# Patient Record
Sex: Female | Born: 1960 | Race: Black or African American | Hispanic: No | Marital: Single | Smoking: Never smoker
Health system: Southern US, Community
[De-identification: ages and names within clinical notes are randomized; demographics above are authoritative.]

## PROBLEM LIST (undated history)

## (undated) DIAGNOSIS — R102 Pelvic and perineal pain: Secondary | ICD-10-CM

## (undated) DIAGNOSIS — F4321 Adjustment disorder with depressed mood: Secondary | ICD-10-CM

## (undated) DIAGNOSIS — B009 Herpesviral infection, unspecified: Secondary | ICD-10-CM

## (undated) DIAGNOSIS — F419 Anxiety disorder, unspecified: Secondary | ICD-10-CM

## (undated) DIAGNOSIS — E66812 Obesity, class 2: Secondary | ICD-10-CM

## (undated) DIAGNOSIS — R51 Headache: Secondary | ICD-10-CM

## (undated) DIAGNOSIS — M069 Rheumatoid arthritis, unspecified: Secondary | ICD-10-CM

## (undated) DIAGNOSIS — I10 Essential (primary) hypertension: Secondary | ICD-10-CM

## (undated) DIAGNOSIS — J302 Other seasonal allergic rhinitis: Secondary | ICD-10-CM

## (undated) DIAGNOSIS — D259 Leiomyoma of uterus, unspecified: Secondary | ICD-10-CM

## (undated) DIAGNOSIS — R011 Cardiac murmur, unspecified: Secondary | ICD-10-CM

## (undated) DIAGNOSIS — A048 Other specified bacterial intestinal infections: Secondary | ICD-10-CM

## (undated) DIAGNOSIS — Z8742 Personal history of other diseases of the female genital tract: Secondary | ICD-10-CM

## (undated) DIAGNOSIS — F439 Reaction to severe stress, unspecified: Secondary | ICD-10-CM

## (undated) DIAGNOSIS — E785 Hyperlipidemia, unspecified: Secondary | ICD-10-CM

## (undated) DIAGNOSIS — N926 Irregular menstruation, unspecified: Secondary | ICD-10-CM

## (undated) DIAGNOSIS — D72829 Elevated white blood cell count, unspecified: Secondary | ICD-10-CM

## (undated) DIAGNOSIS — R112 Nausea with vomiting, unspecified: Secondary | ICD-10-CM

## (undated) DIAGNOSIS — I119 Hypertensive heart disease without heart failure: Secondary | ICD-10-CM

## (undated) DIAGNOSIS — E559 Vitamin D deficiency, unspecified: Secondary | ICD-10-CM

## (undated) DIAGNOSIS — J329 Chronic sinusitis, unspecified: Secondary | ICD-10-CM

## (undated) DIAGNOSIS — B373 Candidiasis of vulva and vagina: Secondary | ICD-10-CM

## (undated) DIAGNOSIS — Z9889 Other specified postprocedural states: Secondary | ICD-10-CM

## (undated) DIAGNOSIS — D219 Benign neoplasm of connective and other soft tissue, unspecified: Secondary | ICD-10-CM

## (undated) DIAGNOSIS — E7800 Pure hypercholesterolemia, unspecified: Secondary | ICD-10-CM

## (undated) DIAGNOSIS — R002 Palpitations: Secondary | ICD-10-CM

## (undated) DIAGNOSIS — Z8719 Personal history of other diseases of the digestive system: Secondary | ICD-10-CM

## (undated) DIAGNOSIS — Z87898 Personal history of other specified conditions: Secondary | ICD-10-CM

## (undated) HISTORY — DX: Rheumatoid arthritis, unspecified: M06.9

## (undated) HISTORY — DX: Other seasonal allergic rhinitis: J30.2

## (undated) HISTORY — DX: Cardiac murmur, unspecified: R01.1

## (undated) HISTORY — DX: Pelvic and perineal pain: R10.2

## (undated) HISTORY — DX: Other specified bacterial intestinal infections: A04.8

## (undated) HISTORY — DX: Reaction to severe stress, unspecified: F43.9

## (undated) HISTORY — DX: Adjustment disorder with depressed mood: F43.21

## (undated) HISTORY — DX: Candidiasis of vulva and vagina: B37.3

## (undated) HISTORY — DX: Personal history of other specified conditions: Z87.898

## (undated) HISTORY — DX: Obesity, class 2: E66.812

## (undated) HISTORY — DX: Irregular menstruation, unspecified: N92.6

## (undated) HISTORY — DX: Hypertensive heart disease without heart failure: I11.9

## (undated) HISTORY — DX: Herpesviral infection, unspecified: B00.9

## (undated) HISTORY — DX: Hyperlipidemia, unspecified: E78.5

## (undated) HISTORY — DX: Anxiety disorder, unspecified: F41.9

## (undated) HISTORY — DX: Personal history of other diseases of the digestive system: Z87.19

## (undated) HISTORY — PX: GANGLION CYST EXCISION: SHX1691

## (undated) HISTORY — DX: Personal history of other diseases of the female genital tract: Z87.42

## (undated) HISTORY — DX: Morbid (severe) obesity due to excess calories: E66.01

## (undated) HISTORY — DX: Leiomyoma of uterus, unspecified: D25.9

## (undated) HISTORY — DX: Pure hypercholesterolemia, unspecified: E78.00

## (undated) HISTORY — PX: BUNIONECTOMY: SHX129

## (undated) HISTORY — DX: Benign neoplasm of connective and other soft tissue, unspecified: D21.9

## (undated) HISTORY — DX: Elevated white blood cell count, unspecified: D72.829

## (undated) HISTORY — DX: Vitamin D deficiency, unspecified: E55.9

## (undated) HISTORY — DX: Chronic sinusitis, unspecified: J32.9

## (undated) HISTORY — DX: Palpitations: R00.2

---

## 1997-09-21 DIAGNOSIS — N926 Irregular menstruation, unspecified: Secondary | ICD-10-CM

## 1997-09-21 HISTORY — DX: Irregular menstruation, unspecified: N92.6

## 1999-09-22 DIAGNOSIS — Z8742 Personal history of other diseases of the female genital tract: Secondary | ICD-10-CM

## 1999-09-22 DIAGNOSIS — D259 Leiomyoma of uterus, unspecified: Secondary | ICD-10-CM

## 1999-09-22 HISTORY — DX: Leiomyoma of uterus, unspecified: D25.9

## 1999-09-22 HISTORY — DX: Personal history of other diseases of the female genital tract: Z87.42

## 1999-11-10 ENCOUNTER — Ambulatory Visit (HOSPITAL_COMMUNITY): Admission: RE | Admit: 1999-11-10 | Discharge: 1999-11-10 | Payer: Self-pay | Admitting: Obstetrics and Gynecology

## 1999-11-10 ENCOUNTER — Encounter: Payer: Self-pay | Admitting: Obstetrics and Gynecology

## 2000-08-04 ENCOUNTER — Other Ambulatory Visit: Admission: RE | Admit: 2000-08-04 | Discharge: 2000-08-04 | Payer: Self-pay | Admitting: Obstetrics and Gynecology

## 2000-08-04 ENCOUNTER — Encounter (INDEPENDENT_AMBULATORY_CARE_PROVIDER_SITE_OTHER): Payer: Self-pay

## 2000-11-16 ENCOUNTER — Encounter: Payer: Self-pay | Admitting: Obstetrics and Gynecology

## 2000-11-16 ENCOUNTER — Ambulatory Visit (HOSPITAL_COMMUNITY): Admission: RE | Admit: 2000-11-16 | Discharge: 2000-11-16 | Payer: Self-pay | Admitting: Obstetrics and Gynecology

## 2001-09-21 DIAGNOSIS — B3731 Acute candidiasis of vulva and vagina: Secondary | ICD-10-CM

## 2001-09-21 DIAGNOSIS — E7800 Pure hypercholesterolemia, unspecified: Secondary | ICD-10-CM

## 2001-09-21 DIAGNOSIS — B373 Candidiasis of vulva and vagina: Secondary | ICD-10-CM

## 2001-09-21 HISTORY — DX: Acute candidiasis of vulva and vagina: B37.31

## 2001-09-21 HISTORY — DX: Pure hypercholesterolemia, unspecified: E78.00

## 2001-09-21 HISTORY — DX: Candidiasis of vulva and vagina: B37.3

## 2002-04-04 ENCOUNTER — Ambulatory Visit (HOSPITAL_COMMUNITY): Admission: RE | Admit: 2002-04-04 | Discharge: 2002-04-04 | Payer: Self-pay | Admitting: Obstetrics and Gynecology

## 2002-04-04 ENCOUNTER — Encounter: Payer: Self-pay | Admitting: Obstetrics and Gynecology

## 2002-05-23 DIAGNOSIS — B009 Herpesviral infection, unspecified: Secondary | ICD-10-CM

## 2002-05-23 HISTORY — DX: Herpesviral infection, unspecified: B00.9

## 2002-09-21 DIAGNOSIS — F439 Reaction to severe stress, unspecified: Secondary | ICD-10-CM

## 2002-09-21 DIAGNOSIS — B373 Candidiasis of vulva and vagina: Secondary | ICD-10-CM

## 2002-09-21 DIAGNOSIS — B3731 Acute candidiasis of vulva and vagina: Secondary | ICD-10-CM

## 2002-09-21 HISTORY — DX: Reaction to severe stress, unspecified: F43.9

## 2002-09-21 HISTORY — DX: Candidiasis of vulva and vagina: B37.3

## 2002-09-21 HISTORY — DX: Acute candidiasis of vulva and vagina: B37.31

## 2003-05-29 ENCOUNTER — Other Ambulatory Visit: Admission: RE | Admit: 2003-05-29 | Discharge: 2003-05-29 | Payer: Self-pay | Admitting: Obstetrics and Gynecology

## 2003-06-19 ENCOUNTER — Encounter: Payer: Self-pay | Admitting: Obstetrics and Gynecology

## 2003-06-19 ENCOUNTER — Ambulatory Visit (HOSPITAL_COMMUNITY): Admission: RE | Admit: 2003-06-19 | Discharge: 2003-06-19 | Payer: Self-pay | Admitting: Obstetrics and Gynecology

## 2004-06-11 ENCOUNTER — Other Ambulatory Visit: Admission: RE | Admit: 2004-06-11 | Discharge: 2004-06-11 | Payer: Self-pay | Admitting: Obstetrics and Gynecology

## 2004-06-24 ENCOUNTER — Ambulatory Visit (HOSPITAL_COMMUNITY): Admission: RE | Admit: 2004-06-24 | Discharge: 2004-06-24 | Payer: Self-pay | Admitting: Obstetrics and Gynecology

## 2004-09-21 DIAGNOSIS — Z8719 Personal history of other diseases of the digestive system: Secondary | ICD-10-CM

## 2004-09-21 HISTORY — DX: Personal history of other diseases of the digestive system: Z87.19

## 2005-06-24 ENCOUNTER — Other Ambulatory Visit: Admission: RE | Admit: 2005-06-24 | Discharge: 2005-06-24 | Payer: Self-pay | Admitting: Obstetrics and Gynecology

## 2005-06-30 ENCOUNTER — Ambulatory Visit (HOSPITAL_COMMUNITY): Admission: RE | Admit: 2005-06-30 | Discharge: 2005-06-30 | Payer: Self-pay | Admitting: Obstetrics and Gynecology

## 2005-07-17 ENCOUNTER — Ambulatory Visit (HOSPITAL_COMMUNITY): Admission: RE | Admit: 2005-07-17 | Discharge: 2005-07-17 | Payer: Self-pay | Admitting: Gastroenterology

## 2005-07-17 ENCOUNTER — Encounter (INDEPENDENT_AMBULATORY_CARE_PROVIDER_SITE_OTHER): Payer: Self-pay | Admitting: *Deleted

## 2006-07-01 ENCOUNTER — Ambulatory Visit (HOSPITAL_COMMUNITY): Admission: RE | Admit: 2006-07-01 | Discharge: 2006-07-01 | Payer: Self-pay | Admitting: Obstetrics and Gynecology

## 2006-07-12 ENCOUNTER — Other Ambulatory Visit: Admission: RE | Admit: 2006-07-12 | Discharge: 2006-07-12 | Payer: Self-pay | Admitting: Obstetrics and Gynecology

## 2007-07-04 ENCOUNTER — Ambulatory Visit (HOSPITAL_COMMUNITY): Admission: RE | Admit: 2007-07-04 | Discharge: 2007-07-04 | Payer: Self-pay | Admitting: Obstetrics and Gynecology

## 2007-09-22 DIAGNOSIS — J329 Chronic sinusitis, unspecified: Secondary | ICD-10-CM

## 2007-09-22 HISTORY — DX: Chronic sinusitis, unspecified: J32.9

## 2008-05-20 ENCOUNTER — Emergency Department (HOSPITAL_COMMUNITY): Admission: EM | Admit: 2008-05-20 | Discharge: 2008-05-20 | Payer: Self-pay | Admitting: Emergency Medicine

## 2008-07-04 ENCOUNTER — Ambulatory Visit (HOSPITAL_COMMUNITY): Admission: RE | Admit: 2008-07-04 | Discharge: 2008-07-04 | Payer: Self-pay | Admitting: Obstetrics and Gynecology

## 2008-07-17 ENCOUNTER — Encounter: Admission: RE | Admit: 2008-07-17 | Discharge: 2008-07-17 | Payer: Self-pay | Admitting: Obstetrics and Gynecology

## 2009-08-13 ENCOUNTER — Ambulatory Visit (HOSPITAL_COMMUNITY): Admission: RE | Admit: 2009-08-13 | Discharge: 2009-08-13 | Payer: Self-pay | Admitting: Obstetrics and Gynecology

## 2010-09-09 ENCOUNTER — Ambulatory Visit (HOSPITAL_COMMUNITY)
Admission: RE | Admit: 2010-09-09 | Discharge: 2010-09-09 | Payer: Self-pay | Source: Home / Self Care | Attending: Family Medicine | Admitting: Family Medicine

## 2010-10-12 ENCOUNTER — Encounter: Payer: Self-pay | Admitting: Obstetrics and Gynecology

## 2011-02-06 NOTE — Op Note (Signed)
Stacey Turner, Stacey Turner                 ACCOUNT NO.:  [id number]   MEDICAL RECORD NO.:  [id number]            PATIENT TYPE:   LOCATION:                                 FACILITY:   PHYSICIAN:  Anselmo Rod, M.D.  DATE OF BIRTH:  [birth year]   DATE OF PROCEDURE:  07/17/2005  DATE OF DISCHARGE:                                 OPERATIVE REPORT   PROCEDURE:  Colonoscopy with cold biopsies x2.   ENDOSCOPIST:  Anselmo Rod, M.D.   INSTRUMENT:  Olympus videocolonoscope.   INDICATIONS FOR PROCEDURE:  A 50 year old African-American female with a  family history of colon cancer in her father who was diagnosed in his 74s,  rule out colonic polyps, masses, etcetera.   PREPROCEDURE PREPARATION:  The patient was fasted for 4 hours prior to the  procedure and prepped OsmoPrep pills the night and the morning of the  procedure.   PREPROCEDURE PHYSICAL:  VITAL SIGNS: The patient with stable vital signs.  NECK:  Supple.  CHEST:  Clear to auscultation.  HEART:  S1, S2 regular.  ABDOMEN:  Soft with normal bowel sounds.   DESCRIPTION OF PROCEDURE:  The patient was placed in the left lateral  decubitus position and sedated with 75 mg of Demerol and 7.5 mg of Versed in  slow incremental doses. Once the patient was adequately sedated, maintained  on low-flow oxygen, and continuous cardiac monitoring; the Olympus  videocolonoscope was advanced from the rectum to the cecum to the  appendiceal orifice.  These were clearly visualized and photographed.  A  small sessile polyp was biopsied from the cecal base and another small  sessile polyp was biopsied from 50 cm.  There was no evidence of  diverticulosis.  A prominent anal papillae was seen on retroflexion.  Small  internal hemorrhoids appreciated on retroflexion as well.  The patient  tolerated the procedure well without complications.   IMPRESSION:  1.Prominent anal papillae seen on retroflexion.  2.Small internal hemorrhoids.  3.A small sessile  polyp biopsied from 50 cm and one from the cecal base.   RECOMMENDATIONS:  1.Await pathology results.  2.Avoid nonsteroidals.  3.Repeat colonoscopy depending on pathology results.  4.Outpatient follow up in the next 2 weeks for further recommendations.      Anselmo Rod, M.D.  Electronically Signed     JNM/MEDQ  D:  07/17/2005  T:  07/18/2005  Job:  [id number]   cc:   Stacie Acres. Cliffton Asters, M.D.  Fax: [phone]

## 2011-04-24 DIAGNOSIS — Z87898 Personal history of other specified conditions: Secondary | ICD-10-CM

## 2011-04-24 HISTORY — DX: Personal history of other specified conditions: Z87.898

## 2011-09-30 ENCOUNTER — Other Ambulatory Visit (HOSPITAL_COMMUNITY): Payer: Self-pay | Admitting: Obstetrics and Gynecology

## 2011-09-30 DIAGNOSIS — Z1231 Encounter for screening mammogram for malignant neoplasm of breast: Secondary | ICD-10-CM

## 2011-10-29 ENCOUNTER — Ambulatory Visit (HOSPITAL_COMMUNITY)
Admission: RE | Admit: 2011-10-29 | Discharge: 2011-10-29 | Disposition: A | Discharge status: Home / Self Care | Payer: BC Managed Care – PPO | Source: Ambulatory Visit | Attending: Obstetrics and Gynecology | Admitting: Obstetrics and Gynecology

## 2011-10-29 DIAGNOSIS — Z1231 Encounter for screening mammogram for malignant neoplasm of breast: Secondary | ICD-10-CM | POA: Insufficient documentation

## 2011-11-03 ENCOUNTER — Other Ambulatory Visit: Payer: Self-pay | Admitting: Obstetrics and Gynecology

## 2011-11-05 MED ORDER — METHYLENE BLUE 1 % INJ SOLN
INTRAMUSCULAR | Status: AC
  Filled 2011-11-05: qty 1

## 2011-11-05 MED ORDER — BUPIVACAINE HCL (PF) 0.25 % IJ SOLN
INTRAMUSCULAR | Status: AC
Start: 2011-11-05 — End: 2011-11-05
  Filled 2011-11-05: qty 30

## 2011-11-23 ENCOUNTER — Encounter (HOSPITAL_COMMUNITY): Payer: Self-pay | Admitting: Pharmacist

## 2011-11-25 ENCOUNTER — Encounter (INDEPENDENT_AMBULATORY_CARE_PROVIDER_SITE_OTHER): Payer: BC Managed Care – PPO | Admitting: Obstetrics and Gynecology

## 2011-11-25 DIAGNOSIS — D259 Leiomyoma of uterus, unspecified: Secondary | ICD-10-CM

## 2011-11-25 DIAGNOSIS — R102 Pelvic and perineal pain: Secondary | ICD-10-CM

## 2011-11-25 HISTORY — DX: Pelvic and perineal pain: R10.2

## 2011-11-26 NOTE — H&P (Signed)
Stacey Turner, Stacey Turner NO.:  [id number]  MEDICAL RECORD NO.:  [id number]  LOCATION:                                 FACILITY:  PHYSICIAN:  Janine Limbo, M.D.DATE OF BIRTH:  1960-11-13  DATE OF ADMISSION:12/09/2011 DATE OF DISCHARGE:                             HISTORY & PHYSICAL   HISTORY OF PRESENT ILLNESS:  Ms. Blish is a 51 year old female, gravida 0, who presents for an abdominal myomectomy.  The patient has been followed at the [facility].  The patient complains of increasing dysmenorrhea, abdominal bloating, and pelvic pain.  An ultrasound was performed, which showed a very large fibroid uterus with the largest fibroid measuring 10.4 cm in size.  The ovaries appeared normal.  The patient's most recent Pap smear was within normal limits.  DRUG ALLERGIES:  Penicillin causes hives and itching.  Latex causes itching.  PAST MEDICAL HISTORY:  The patient has a history of constipation and hemorrhoids. The patient has a history of hypertension and hypercholesterolemia, she currently takes medication for both.  SOCIAL HISTORY:  The patient denies cigarette use, alcohol use, and recreational drug use.  REVIEW OF SYSTEMS:  Please see history of present illness.  FAMILY HISTORY:  Noncontributory.  PHYSICAL EXAMINATION:  VITAL SIGNS:  Weight is 175 pounds, height is 5 feet 2 inches. HEENT:  Within normal limits. CHEST:  Clear. HEART:  Regular rate and rhythm. BREASTS:  Without masses. ABDOMEN:  Nontender and fibroids are palpable in the lower quadrants. EXTREMITIES:  Grossly normal. NEUROLOGIC:  Grossly normal. PELVIC:  External genitalia is normal.  Vagina is normal.  Cervix is nontender.  Uterus is 16 week size and irregular.  Adnexa, no masses and rectovaginal exam confirms.  ASSESSMENT: 1. Fibroid uterus. 2. Dysmenorrhea. 3. Pelvic pain and bloating. 4.  Hypertension. 5. Hypercholesterolemia.  PLAN:  The patient will undergo an abdominal myomectomy.  We discussed her treatment alternatives which include not having surgery at all, hysterectomy, and multiple other therapies for fibroids.  She wishes to maintain her childbearing potential and she wishes to proceed with myomectomy.  She declines the robotic option.  She understands and accepts the risks of, but not limited to, anesthetic complications, bleeding, infections, and possible damage to the surrounding organs.     Janine Limbo, M.D.     AVS/MEDQ  D:  11/25/2011  T:  11/26/2011  Job:  [id number]  cc:   Stacie Acres. Cliffton Asters, M.D. Fax: [phone]

## 2011-12-01 ENCOUNTER — Encounter (HOSPITAL_COMMUNITY)
Admission: RE | Admit: 2011-12-01 | Discharge: 2011-12-01 | Disposition: A | Discharge status: Home / Self Care | Payer: BC Managed Care – PPO | Source: Ambulatory Visit | Attending: Obstetrics and Gynecology | Admitting: Obstetrics and Gynecology

## 2011-12-01 ENCOUNTER — Other Ambulatory Visit: Payer: Self-pay

## 2011-12-01 ENCOUNTER — Encounter (HOSPITAL_COMMUNITY): Payer: Self-pay

## 2011-12-01 HISTORY — DX: Essential (primary) hypertension: I10

## 2011-12-01 HISTORY — DX: Other specified postprocedural states: Z98.890

## 2011-12-01 HISTORY — DX: Nausea with vomiting, unspecified: R11.2

## 2011-12-01 HISTORY — DX: Headache: R51

## 2011-12-01 LAB — SURGICAL PCR SCREEN
MRSA, PCR: NEGATIVE
Staphylococcus aureus: NEGATIVE

## 2011-12-01 LAB — BASIC METABOLIC PANEL
CO2: 29 mEq/L (ref 19–32)
Chloride: 106 mEq/L (ref 96–112)
GFR calc Af Amer: >90 mL/min (ref >90)
Potassium: 3.9 mEq/L (ref 3.5–5.1)
Sodium: 141 mEq/L (ref 135–145)

## 2011-12-01 LAB — CBC
Platelets: 221 10*3/uL (ref 150–400)
RBC: 4.38 MIL/uL (ref 3.87–5.11)
RDW: 12.9 % (ref 11.5–15.5)
WBC: 4.5 10*3/uL (ref 4.0–10.5)

## 2011-12-01 NOTE — Patient Instructions (Signed)
YOUR PROCEDURE IS SCHEDULED ON:12/09/11  ENTER THROUGH THE MAIN ENTRANCE OF [facility] AT:0800 am USE DESK PHONE AND DIAL [phone] TO INFORM us OF YOUR ARRIVAL  CALL [phone] IF YOU HAVE ANY QUESTIONS OR PROBLEMS PRIOR TO YOUR ARRIVAL.  REMEMBER: DO NOT EAT OR DRINK AFTER MIDNIGHT : Tuesday  SPECIAL INSTRUCTIONS:take BP pill am of surgery   YOU MAY BRUSH YOUR TEETH THE MORNING OF SURGERY     DO NOT WEAR JEWELRY, EYE MAKEUP, LIPSTICK OR DARK FINGERNAIL POLISH DO NOT WEAR LOTIONS  DO NOT SHAVE FOR 48 HOURS PRIOR TO SURGERY  YOU WILL NOT BE ALLOWED TO DRIVE YOURSELF HOME.  NAME OF DRIVER: sister Sunny Schlein or Thurston Hole

## 2011-12-08 MED ORDER — GENTAMICIN SULFATE 40 MG/ML IJ SOLN
INTRAVENOUS | Status: AC
  Administered 2011-12-09: 100 mL via INTRAVENOUS
  Filled 2011-12-08: qty 2.5

## 2011-12-09 ENCOUNTER — Encounter (HOSPITAL_COMMUNITY): Payer: Self-pay | Admitting: Anesthesiology

## 2011-12-09 ENCOUNTER — Encounter (HOSPITAL_COMMUNITY): Payer: Self-pay | Admitting: *Deleted

## 2011-12-09 ENCOUNTER — Encounter (HOSPITAL_COMMUNITY)
Admission: RE | Disposition: A | Discharge status: Home / Self Care | Payer: Self-pay | Source: Ambulatory Visit | Attending: Obstetrics and Gynecology

## 2011-12-09 ENCOUNTER — Inpatient Hospital Stay (HOSPITAL_COMMUNITY)
Admission: RE | Admit: 2011-12-09 | Discharge: 2011-12-11 | DRG: 359 | Disposition: A | Discharge status: Home / Self Care | Payer: BC Managed Care – PPO | Source: Ambulatory Visit | Attending: Obstetrics and Gynecology | Admitting: Obstetrics and Gynecology

## 2011-12-09 ENCOUNTER — Inpatient Hospital Stay (HOSPITAL_COMMUNITY): Payer: BC Managed Care – PPO | Admitting: Anesthesiology

## 2011-12-09 DIAGNOSIS — D252 Subserosal leiomyoma of uterus: Principal | ICD-10-CM | POA: Diagnosis present

## 2011-12-09 DIAGNOSIS — R109 Unspecified abdominal pain: Secondary | ICD-10-CM

## 2011-12-09 DIAGNOSIS — Z01818 Encounter for other preprocedural examination: Secondary | ICD-10-CM

## 2011-12-09 DIAGNOSIS — D259 Leiomyoma of uterus, unspecified: Secondary | ICD-10-CM

## 2011-12-09 DIAGNOSIS — Z01812 Encounter for preprocedural laboratory examination: Secondary | ICD-10-CM

## 2011-12-09 HISTORY — PX: MYOMECTOMY: SHX85

## 2011-12-09 LAB — PREGNANCY, URINE: Preg Test, Ur: NEGATIVE

## 2011-12-09 SURGERY — MYOMECTOMY, ABDOMINAL APPROACH
Anesthesia: General | Site: Abdomen | Wound class: Clean Contaminated

## 2011-12-09 MED ORDER — MENTHOL 3 MG MT LOZG: 1.0000 | LOZENGE | OROMUCOSAL | Status: DC | PRN

## 2011-12-09 MED ORDER — MONTELUKAST SODIUM 10 MG PO TABS
10.0000 mg | ORAL_TABLET | Freq: Every day | ORAL | Status: DC
Start: 2011-12-09 — End: 2011-12-11
  Filled 2011-12-09 (×2): qty 1

## 2011-12-09 MED ORDER — DEXAMETHASONE SODIUM PHOSPHATE 4 MG/ML IJ SOLN
INTRAMUSCULAR | Status: DC | PRN
  Administered 2011-12-09: 6 mg via INTRAVENOUS

## 2011-12-09 MED ORDER — BUPIVACAINE-EPINEPHRINE 0.5% -1:200000 IJ SOLN
INTRAMUSCULAR | Status: DC | PRN
  Administered 2011-12-09: 5 mL

## 2011-12-09 MED ORDER — NEOSTIGMINE METHYLSULFATE 1 MG/ML IJ SOLN
INTRAMUSCULAR | Status: AC
  Filled 2011-12-09: qty 10

## 2011-12-09 MED ORDER — VASOPRESSIN 20 UNIT/ML IJ SOLN
INTRAVENOUS | Status: DC | PRN
  Administered 2011-12-09 (×3): via INTRAMUSCULAR

## 2011-12-09 MED ORDER — ONDANSETRON HCL 4 MG PO TABS: 4.0000 mg | ORAL_TABLET | Freq: Four times a day (QID) | ORAL | Status: DC | PRN

## 2011-12-09 MED ORDER — NEOSTIGMINE METHYLSULFATE 1 MG/ML IJ SOLN
INTRAMUSCULAR | Status: DC | PRN
  Administered 2011-12-09: 3 mg via INTRAVENOUS

## 2011-12-09 MED ORDER — DEXTROSE IN LACTATED RINGERS 5 % IV SOLN
INTRAVENOUS | Status: DC
  Administered 2011-12-09 – 2011-12-10 (×2): via INTRAVENOUS

## 2011-12-09 MED ORDER — NALOXONE HCL 0.4 MG/ML IJ SOLN: 0.4000 mg | INTRAMUSCULAR | Status: DC | PRN

## 2011-12-09 MED ORDER — ROCURONIUM BROMIDE 50 MG/5ML IV SOLN
INTRAVENOUS | Status: AC
  Filled 2011-12-09: qty 1

## 2011-12-09 MED ORDER — ONDANSETRON HCL 4 MG/2ML IJ SOLN: 4.0000 mg | Freq: Four times a day (QID) | INTRAMUSCULAR | Status: DC | PRN

## 2011-12-09 MED ORDER — KETOROLAC TROMETHAMINE 30 MG/ML IJ SOLN
INTRAMUSCULAR | Status: DC | PRN
  Administered 2011-12-09: 30 mg via INTRAVENOUS

## 2011-12-09 MED ORDER — DIPHENHYDRAMINE HCL 12.5 MG/5ML PO ELIX: 12.5000 mg | ORAL_SOLUTION | Freq: Four times a day (QID) | ORAL | Status: DC | PRN

## 2011-12-09 MED ORDER — PROPOFOL 10 MG/ML IV EMUL
INTRAVENOUS | Status: DC | PRN
  Administered 2011-12-09: 200 mg via INTRAVENOUS

## 2011-12-09 MED ORDER — AZELASTINE HCL 0.1 % NA SOLN
1.0000 | Freq: Two times a day (BID) | NASAL | Status: DC
  Filled 2011-12-09: qty 30

## 2011-12-09 MED ORDER — LIDOCAINE HCL (CARDIAC) 20 MG/ML IV SOLN
INTRAVENOUS | Status: AC
  Filled 2011-12-09: qty 5

## 2011-12-09 MED ORDER — GLYCOPYRROLATE 0.2 MG/ML IJ SOLN
INTRAMUSCULAR | Status: AC
Start: 2011-12-09 — End: 2011-12-09
  Filled 2011-12-09: qty 2

## 2011-12-09 MED ORDER — HYDROMORPHONE 0.3 MG/ML IV SOLN
INTRAVENOUS | Status: AC
  Filled 2011-12-09: qty 25

## 2011-12-09 MED ORDER — DOCUSATE SODIUM 100 MG PO CAPS
100.0000 mg | ORAL_CAPSULE | Freq: Two times a day (BID) | ORAL | Status: DC
  Administered 2011-12-09 – 2011-12-11 (×4): 100 mg via ORAL
  Filled 2011-12-09 (×4): qty 1

## 2011-12-09 MED ORDER — LACTATED RINGERS IV SOLN
INTRAVENOUS | Status: DC
  Administered 2011-12-09 (×4): via INTRAVENOUS

## 2011-12-09 MED ORDER — DIPHENHYDRAMINE HCL 50 MG/ML IJ SOLN: 12.5000 mg | Freq: Four times a day (QID) | INTRAMUSCULAR | Status: DC | PRN

## 2011-12-09 MED ORDER — METOCLOPRAMIDE HCL 5 MG/ML IJ SOLN: 10.0000 mg | Freq: Once | INTRAMUSCULAR | Status: DC | PRN

## 2011-12-09 MED ORDER — FENTANYL CITRATE 0.05 MG/ML IJ SOLN
INTRAMUSCULAR | Status: AC
  Filled 2011-12-09: qty 2

## 2011-12-09 MED ORDER — HYDROMORPHONE HCL PF 1 MG/ML IJ SOLN
0.2500 mg | INTRAMUSCULAR | Status: DC | PRN
  Administered 2011-12-09 (×2): 0.5 mg via INTRAVENOUS

## 2011-12-09 MED ORDER — POLYETHYLENE GLYCOL 3350 17 G PO PACK
17.0000 g | PACK | Freq: Every day | ORAL | Status: DC | PRN
  Filled 2011-12-09: qty 1

## 2011-12-09 MED ORDER — SCOPOLAMINE 1 MG/3DAYS TD PT72
1.0000 | MEDICATED_PATCH | Freq: Once | TRANSDERMAL | Status: DC
  Administered 2011-12-09: 1.5 mg via TRANSDERMAL

## 2011-12-09 MED ORDER — KETOROLAC TROMETHAMINE 30 MG/ML IJ SOLN
30.0000 mg | Freq: Four times a day (QID) | INTRAMUSCULAR | Status: DC
  Administered 2011-12-09 – 2011-12-10 (×2): 30 mg via INTRAVENOUS
  Filled 2011-12-09 (×2): qty 1

## 2011-12-09 MED ORDER — MEPERIDINE HCL 25 MG/ML IJ SOLN: 6.2500 mg | INTRAMUSCULAR | Status: DC | PRN

## 2011-12-09 MED ORDER — DIPHENHYDRAMINE HCL 12.5 MG/5ML PO ELIX
12.5000 mg | ORAL_SOLUTION | Freq: Four times a day (QID) | ORAL | Status: DC | PRN
  Filled 2011-12-09: qty 5

## 2011-12-09 MED ORDER — BUPIVACAINE HCL (PF) 0.5 % IJ SOLN
INTRAMUSCULAR | Status: AC
  Filled 2011-12-09: qty 30

## 2011-12-09 MED ORDER — FENTANYL CITRATE 0.05 MG/ML IJ SOLN
INTRAMUSCULAR | Status: DC | PRN
  Administered 2011-12-09: 50 ug via INTRAVENOUS
  Administered 2011-12-09: 100 ug via INTRAVENOUS
  Administered 2011-12-09: 50 ug via INTRAVENOUS
  Administered 2011-12-09: 150 ug via INTRAVENOUS

## 2011-12-09 MED ORDER — SODIUM CHLORIDE 0.9 % IJ SOLN: 9.0000 mL | INTRAMUSCULAR | Status: DC | PRN

## 2011-12-09 MED ORDER — KETOROLAC TROMETHAMINE 60 MG/2ML IM SOLN
INTRAMUSCULAR | Status: DC | PRN
  Administered 2011-12-09: 30 mg via INTRAMUSCULAR

## 2011-12-09 MED ORDER — LIDOCAINE HCL (CARDIAC) 20 MG/ML IV SOLN
INTRAVENOUS | Status: DC | PRN
  Administered 2011-12-09: 80 mg via INTRAVENOUS

## 2011-12-09 MED ORDER — ZOLPIDEM TARTRATE 5 MG PO TABS: 5.0000 mg | ORAL_TABLET | Freq: Every evening | ORAL | Status: DC | PRN

## 2011-12-09 MED ORDER — MIDAZOLAM HCL 2 MG/2ML IJ SOLN
INTRAMUSCULAR | Status: AC
  Filled 2011-12-09: qty 2

## 2011-12-09 MED ORDER — HYDROMORPHONE 0.3 MG/ML IV SOLN
INTRAVENOUS | Status: DC
  Administered 2011-12-09: 2 mL via INTRAVENOUS
  Administered 2011-12-09: 14:00:00 via INTRAVENOUS
  Administered 2011-12-10: 1 mL via INTRAVENOUS

## 2011-12-09 MED ORDER — ROCURONIUM BROMIDE 100 MG/10ML IV SOLN
INTRAVENOUS | Status: DC | PRN
  Administered 2011-12-09: 20 mg via INTRAVENOUS
  Administered 2011-12-09: 50 mg via INTRAVENOUS

## 2011-12-09 MED ORDER — FENTANYL CITRATE 0.05 MG/ML IJ SOLN
INTRAMUSCULAR | Status: AC
  Filled 2011-12-09: qty 5

## 2011-12-09 MED ORDER — ONDANSETRON HCL 4 MG/2ML IJ SOLN
INTRAMUSCULAR | Status: AC
  Filled 2011-12-09: qty 2

## 2011-12-09 MED ORDER — GLYCOPYRROLATE 0.2 MG/ML IJ SOLN
INTRAMUSCULAR | Status: DC | PRN
  Administered 2011-12-09: 0.6 mg via INTRAVENOUS

## 2011-12-09 MED ORDER — SIMETHICONE 80 MG PO CHEW: 80.0000 mg | CHEWABLE_TABLET | Freq: Four times a day (QID) | ORAL | Status: DC | PRN

## 2011-12-09 MED ORDER — KETOROLAC TROMETHAMINE 60 MG/2ML IM SOLN
INTRAMUSCULAR | Status: AC
  Filled 2011-12-09: qty 2

## 2011-12-09 MED ORDER — ENALAPRIL MALEATE 5 MG PO TABS
5.0000 mg | ORAL_TABLET | Freq: Every morning | ORAL | Status: DC
  Administered 2011-12-10 – 2011-12-11 (×2): 5 mg via ORAL
  Filled 2011-12-09 (×2): qty 1

## 2011-12-09 MED ORDER — OXYCODONE-ACETAMINOPHEN 5-325 MG PO TABS
1.0000 | ORAL_TABLET | ORAL | Status: DC | PRN
  Administered 2011-12-10 (×2): 1 via ORAL
  Administered 2011-12-10 – 2011-12-11 (×3): 2 via ORAL
  Filled 2011-12-09: qty 2
  Filled 2011-12-09 (×2): qty 1
  Filled 2011-12-09 (×2): qty 2

## 2011-12-09 MED ORDER — SCOPOLAMINE 1 MG/3DAYS TD PT72
MEDICATED_PATCH | TRANSDERMAL | Status: AC
  Administered 2011-12-09: 1.5 mg via TRANSDERMAL
  Filled 2011-12-09: qty 1

## 2011-12-09 MED ORDER — IBUPROFEN 800 MG PO TABS: 800.0000 mg | ORAL_TABLET | Freq: Three times a day (TID) | ORAL | Status: DC | PRN

## 2011-12-09 MED ORDER — PROPOFOL 10 MG/ML IV EMUL
INTRAVENOUS | Status: AC
  Filled 2011-12-09: qty 20

## 2011-12-09 MED ORDER — ONDANSETRON HCL 4 MG/2ML IJ SOLN
INTRAMUSCULAR | Status: DC | PRN
  Administered 2011-12-09: 4 mg via INTRAVENOUS

## 2011-12-09 MED ORDER — MIDAZOLAM HCL 5 MG/5ML IJ SOLN
INTRAMUSCULAR | Status: DC | PRN
  Administered 2011-12-09: 2 mg via INTRAVENOUS

## 2011-12-09 MED ORDER — DEXAMETHASONE SODIUM PHOSPHATE 10 MG/ML IJ SOLN
INTRAMUSCULAR | Status: AC
  Filled 2011-12-09: qty 1

## 2011-12-09 MED ORDER — HYDROMORPHONE HCL PF 1 MG/ML IJ SOLN
INTRAMUSCULAR | Status: AC
  Administered 2011-12-09: 0.5 mg via INTRAVENOUS
  Filled 2011-12-09: qty 1

## 2011-12-09 MED ORDER — VASOPRESSIN 20 UNIT/ML IJ SOLN
INTRAMUSCULAR | Status: AC
  Filled 2011-12-09: qty 1

## 2011-12-09 SURGICAL SUPPLY — 46 items
BARRIER ADHS 3X4 INTERCEED (GAUZE/BANDAGES/DRESSINGS) ×2 IMPLANT
BLADE HEX COATED 2.75 (ELECTRODE) IMPLANT
CANISTER SUCTION 2500CC (MISCELLANEOUS) ×2 IMPLANT
CHLORAPREP W/TINT 26ML (MISCELLANEOUS) ×2 IMPLANT
CLOTH BEACON ORANGE TIMEOUT ST (SAFETY) ×2 IMPLANT
CONT PATH 16OZ SNAP LID 3702 (MISCELLANEOUS) ×2 IMPLANT
DECANTER SPIKE VIAL GLASS SM (MISCELLANEOUS) IMPLANT
DRAIN JACKSON PRT FLT 7MM (DRAIN) IMPLANT
DRAPE CESAREAN BIRTH W POUCH (DRAPES) ×2 IMPLANT
DRESSING TELFA 8X3 (GAUZE/BANDAGES/DRESSINGS) ×2 IMPLANT
EVACUATOR SILICONE 100CC (DRAIN) IMPLANT
GAUZE SPONGE 4X4 16PLY XRAY LF (GAUZE/BANDAGES/DRESSINGS) ×2 IMPLANT
GLOVE BIOGEL PI IND STRL 8.5 (GLOVE) ×1 IMPLANT
GLOVE BIOGEL PI INDICATOR 8.5 (GLOVE) ×1
GLOVE ECLIPSE 8.0 STRL XLNG CF (GLOVE) ×4 IMPLANT
GOWN PREVENTION PLUS LG XLONG (DISPOSABLE) ×4 IMPLANT
GOWN PREVENTION PLUS XXLARGE (GOWN DISPOSABLE) ×2 IMPLANT
NEEDLE HYPO 25X1 1.5 SAFETY (NEEDLE) ×2 IMPLANT
NS IRRIG 1000ML POUR BTL (IV SOLUTION) ×4 IMPLANT
PACK ABDOMINAL GYN (CUSTOM PROCEDURE TRAY) ×2 IMPLANT
PAD ABD 7.5X8 STRL (GAUZE/BANDAGES/DRESSINGS) ×2 IMPLANT
PAD OB MATERNITY 4.3X12.25 (PERSONAL CARE ITEMS) ×2 IMPLANT
RINGERS IRRIG 1000ML POUR BTL (IV SOLUTION) IMPLANT
SPONGE GAUZE 4X4 12PLY (GAUZE/BANDAGES/DRESSINGS) ×4 IMPLANT
SPONGE LAP 18X18 X RAY DECT (DISPOSABLE) ×2 IMPLANT
STAPLER VISISTAT 35W (STAPLE) IMPLANT
SUT MNCRL AB 3-0 PS2 27 (SUTURE) IMPLANT
SUT PDS AB 0 CT 36 (SUTURE) IMPLANT
SUT VIC AB 0 CT1 18XCR BRD8 (SUTURE) ×3 IMPLANT
SUT VIC AB 0 CT1 27 (SUTURE) ×2
SUT VIC AB 0 CT1 27XBRD ANBCTR (SUTURE) ×2 IMPLANT
SUT VIC AB 0 CT1 8-18 (SUTURE) ×3
SUT VIC AB 2-0 CT1 27 (SUTURE)
SUT VIC AB 2-0 CT1 TAPERPNT 27 (SUTURE) IMPLANT
SUT VIC AB 3-0 CT1 27 (SUTURE) ×1
SUT VIC AB 3-0 CT1 TAPERPNT 27 (SUTURE) ×1 IMPLANT
SUT VIC AB 3-0 SH 27 (SUTURE) ×5
SUT VIC AB 3-0 SH 27X BRD (SUTURE) ×5 IMPLANT
SUT VIC AB 4-0 SH 27 (SUTURE)
SUT VIC AB 4-0 SH 27XANBCTRL (SUTURE) IMPLANT
SUT VICRYL 0 TIES 12 18 (SUTURE) ×2 IMPLANT
SYR CONTROL 10ML LL (SYRINGE) ×2 IMPLANT
TAPE CLOTH SURG 4X10 WHT LF (GAUZE/BANDAGES/DRESSINGS) ×2 IMPLANT
TOWEL OR 17X24 6PK STRL BLUE (TOWEL DISPOSABLE) ×4 IMPLANT
TRAY FOLEY CATH 14FR (SET/KITS/TRAYS/PACK) ×2 IMPLANT
WATER STERILE IRR 1000ML POUR (IV SOLUTION) IMPLANT

## 2011-12-09 NOTE — Anesthesia Postprocedure Evaluation (Signed)
  Anesthesia Post-op Note  Patient: Stacey Turner  Procedure(s) Performed: Procedure(s) (LRB): MYOMECTOMY (N/A)  Patient Location: PACU and [facility]  Anesthesia Type: General  Level of Consciousness: awake, alert  and oriented  Airway and Oxygen Therapy: Patient Spontanous Breathing and Patient connected to nasal cannula oxygen  Post-op Pain: mild  Post-op Assessment: Post-op Vital signs reviewed  Post-op Vital Signs: stable  Complications: No apparent anesthesia complications

## 2011-12-09 NOTE — Anesthesia Preprocedure Evaluation (Addendum)
Anesthesia Evaluation    History of Anesthesia Complications (+) PONV  Airway Mallampati: II TM Distance: >3 FB Neck ROM: Full    Dental No notable dental hx. (+) Teeth Intact   Pulmonary neg pulmonary ROS, asthma ,  breath sounds clear to auscultation  Pulmonary exam normal       Cardiovascular hypertension, Pt. on medications Rhythm:Regular Rate:Normal     Neuro/Psych  Headaches, negative psych ROS   GI/Hepatic negative GI ROS, Neg liver ROS, GERD-  ,  Endo/Other  negative endocrine ROS  Renal/GU negative Renal ROS  negative genitourinary   Musculoskeletal negative musculoskeletal ROS (+)   Abdominal Normal abdominal exam  (+)   Peds  Hematology negative hematology ROS (+)   Anesthesia Other Findings   Reproductive/Obstetrics Fibroid uterus                          Anesthesia Physical Anesthesia Plan  ASA: II  Anesthesia Plan: General   Post-op Pain Management:    Induction: Intravenous  Airway Management Planned: Oral ETT  Additional Equipment:   Intra-op Plan:   Post-operative Plan: Extubation in OR  Informed Consent: I have reviewed the patients History and Physical, chart, labs and discussed the procedure including the risks, benefits and alternatives for the proposed anesthesia with the patient or authorized representative who has indicated his/her understanding and acceptance.   Dental advisory given  Plan Discussed with: CRNA, Anesthesiologist and Surgeon  Anesthesia Plan Comments:         Anesthesia Quick Evaluation

## 2011-12-09 NOTE — Anesthesia Postprocedure Evaluation (Signed)
Anesthesia Post Note  Patient: Stacey Turner  Procedure(s) Performed: Procedure(s) (LRB): MYOMECTOMY (N/A)  Anesthesia type: General  Patient location: PACU  Post pain: Pain level controlled  Post assessment: Post-op Vital signs reviewed  Last Vitals:  Filed Vitals:   12/09/11 1145  BP: 109/66  Pulse: 78  Temp:   Resp: 12    Post vital signs: Reviewed  Level of consciousness: sedated  Complications: No apparent anesthesia complicationsfj

## 2011-12-09 NOTE — Anesthesia Procedure Notes (Signed)
Procedure Name: Intubation Date/Time: 12/09/2011 9:35 AM Performed by: Kendal Hymen Pre-anesthesia Checklist: Patient identified, Patient being monitored, Timeout performed, Emergency Drugs available and Suction available Patient Re-evaluated:Patient Re-evaluated prior to inductionOxygen Delivery Method: Circle system utilized and Simple face mask Preoxygenation: Pre-oxygenation with 100% oxygen Intubation Type: IV induction Ventilation: Mask ventilation without difficulty Laryngoscope Size: Miller and 2 Grade View: Grade III Tube size: 7.0 mm Number of attempts: 3 (2 attempts then glidescope used by Dr. Malen Gauze) Airway Equipment and Method: Stylet Placement Confirmation: ETT inserted through vocal cords under direct vision,  positive ETCO2 and breath sounds checked- equal and bilateral Secured at: 21 cm Tube secured with: Tape Dental Injury: Teeth and Oropharynx as per pre-operative assessment  Difficulty Due To: Difficulty was unanticipated

## 2011-12-09 NOTE — Transfer of Care (Signed)
Immediate Anesthesia Transfer of Care Note  Patient: Stacey Turner  Procedure(s) Performed: Procedure(s) (LRB): MYOMECTOMY (N/A)  Patient Location: PACU  Anesthesia Type: General  Level of Consciousness: awake, alert  and sedated  Airway & Oxygen Therapy: Patient Spontanous Breathing and Patient connected to nasal cannula oxygen  Post-op Assessment: Report given to PACU RN and Post -op Vital signs reviewed and stable  Post vital signs: Reviewed and stable  Complications: No apparent anesthesia complications

## 2011-12-09 NOTE — H&P (Signed)
The patient was interviewed and examined today.  The previously documented history and physical examination was reviewed. There are no changes. The operative procedure was reviewed. The risks and benefits were outlined again. The specific risks include, but are not limited to, anesthetic complications, bleeding, infections, and possible damage to the surrounding organs. The patient's questions were answered.  We are ready to proceed as outlined. The likelihood of the patient achieving the goals of this procedure is very likely.   Jakell Trusty Vernon Lavonna Lampron, M.D.  

## 2011-12-09 NOTE — Addendum Note (Signed)
Addendum  created 12/09/11 1650 by Armanda Heritage, RN   Modules edited:Anesthesia Events, Charges VN, Notes Section

## 2011-12-09 NOTE — Op Note (Signed)
OPERATIVE NOTE  Stacey Turner  DOB:    1961-08-31  MRN:    [id number]  CSN:    [id number]  Date of Surgery:  12/09/2011  Preoperative Diagnosis:  Fibroid uterus  Abdominal pain  Postoperative Diagnosis:  Fibroid uterus  Abdominal pain  Probable adenomyosis  Procedure:  Abdominal myomectomy  Biopsy of the myometrium  Surgeon:  Leonard Schwartz, M.D.  Assistant:  Dierdre Forth M.D.  Anesthetic:  General  Disposition:  The patient is a 51 year old female who presents with the above-mentioned diagnosis. She understands the indications for surgical procedure. She understands the alternative treatment options. She does not wish to have her uterus removed. She accepts the risk of, but not limited to, anesthetic complications, bleeding, infections, and possible damage to the surrounding organs.  Findings:  A multi-fibroid uterus was present. The largest fibroid was located on the fundus and measure approximately 8-9 cm in size. A total of 8 fibroids were removed. The weight of the fibroids is 370 g. The fallopian tubes and the ovaries appeared normal. The bowel appeared normal.  Procedure:  The patient was taken to the operating room where a general anesthetic was given. The patient's abdomen was prepped with ChloraPrep. The perineum and vagina were prepped with Betadine. A Foley catheter was placed in the bladder. The patient was sterilely draped. The lower abdomen was injected with a diluted solution of half percent Marcaine, saline, and Pitocin. A low transverse incision was made in the abdomen and incision was carried sharply through the subcutaneous tissue, the fascia, and the anterior peritoneum. The fibroid uterus was elevated intraoperative field. The bowel was packed out of the pelvis. The large fibroid was injected with Pitressin and saline. An incision was made in the serosa of the uterus and the large fibroid was removed using combination of sharp and  blunt dissection. Hemostasis was achieved using figure-of-eight sutures. For the fibroids were then removed from the same incision. The patient appeared to have adenomyosis and a biopsy was obtained from the myometrium. The other fibroids were injected with chest and saline. They were sharply removed. At this point all fibroids were thought to be removed. The defect in the uterus were closed using deep sutures. The serosa of the uterus was then closed using a "baseball stitch" of 3-0 Vicryl. Hemostasis was noted to be adequate throughout. Interceed was placed overall incisions. The uterus was returned to the pelvis. All instruments and packs were removed. The pelvis was irrigated. The anterior peritoneum was closed using a running suture of 3-0 Vicryl. The fascia was closed using 2 running sutures of 0 Vicryl from the corners to the midline. The subcutaneous layer was closed using interrupted sutures of 0 Vicryl. The skin was reapproximated using a subcuticular suture of 3-0 Monocryl. A pressure dressing was applied. Sponge, needle, and isthmic counts were correct on 2 occasions. The estimated blood loss was 200 cc. 0 Vicryl is the suture material used except where otherwise mentioned. The patient was awakened from her anesthetic without difficulty. She was transported to the recovery room in stable condition. The fibroids and the biopsy of the myometrium were sent to pathology.  Leonard Schwartz, M.D.

## 2011-12-10 ENCOUNTER — Encounter (HOSPITAL_COMMUNITY): Payer: Self-pay | Admitting: Obstetrics and Gynecology

## 2011-12-10 LAB — CBC
Hemoglobin: 9.6 g/dL — ABNORMAL LOW (ref 12.0–15.0)
MCHC: 32.9 g/dL (ref 30.0–36.0)
RDW: 13.2 % (ref 11.5–15.5)

## 2011-12-10 NOTE — Progress Notes (Signed)
UR Chart review completed.  

## 2011-12-10 NOTE — Progress Notes (Signed)
Subjective: Patient reports tolerating PO and no problems voiding.    Objective: I have reviewed patient's vital signs.  General: alert and cooperative Resp: clear to auscultation bilaterally Cardio: regular rate and rhythm, S1, S2 normal, no murmur, click, rub or gallop GI: soft, non-tender; bowel sounds normal; no masses,  no organomegaly Extremities: extremities normal, atraumatic, no cyanosis or edema  CBC    Component Value Date/Time   WBC 10.2 12/10/2011 0500   RBC 3.51* 12/10/2011 0500   HGB 9.6* 12/10/2011 0500   HCT 29.2* 12/10/2011 0500   PLT 167 12/10/2011 0500   MCV 83.2 12/10/2011 0500   MCH 27.4 12/10/2011 0500   MCHC 32.9 12/10/2011 0500   RDW 13.2 12/10/2011 0500   BP 126/69  Pulse 74  Temp(Src) 98.5 F (36.9 C) (Oral)  Resp 18  Ht 5\' 2"  (1.575 m)  Wt 173 lb (78.472 kg)  BMI 31.64 kg/m2  SpO2 100%  Assessment/Plan: Doing Well Acceptable post op anemia Home in the morning.  LOS: 1 day    Arantza Darrington V 12/10/2011, 8:27 AM

## 2011-12-11 MED ORDER — ONDANSETRON HCL 4 MG PO TABS: 4.0000 mg | ORAL_TABLET | Freq: Four times a day (QID) | ORAL | Status: AC | PRN

## 2011-12-11 MED ORDER — OXYCODONE-ACETAMINOPHEN 5-325 MG PO TABS: 1.0000 | ORAL_TABLET | ORAL | Status: AC | PRN

## 2011-12-11 MED ORDER — FERROUS SULFATE 325 (65 FE) MG PO TABS: 325.0000 mg | ORAL_TABLET | Freq: Three times a day (TID) | ORAL | Status: AC

## 2011-12-11 MED ORDER — IBUPROFEN 800 MG PO TABS: 800.0000 mg | ORAL_TABLET | Freq: Three times a day (TID) | ORAL | Status: AC | PRN

## 2011-12-11 NOTE — Progress Notes (Signed)
Pt discharged to home with sister.  Condition stable.  Pt ambulated to car with V. Pittman, NT.  No equipment for home ordered at discharge. 

## 2011-12-11 NOTE — Progress Notes (Signed)
Subjective: Patient reports tolerating PO, + flatus, + BM and no problems voiding.    Objective:  BP 95/65  Pulse 79  Temp(Src) 98.6 F (37 C) (Oral)  Resp 18  Ht 5\' 2"  (1.575 m)  Wt 173 lb (78.472 kg)  BMI 31.64 kg/m2  SpO2 95%  I have reviewed patient's vital signs and labs.  General: alert and cooperative Resp: clear to auscultation bilaterally Cardio: regular rate and rhythm, S1, S2 normal, no murmur, click, rub or gallop GI: soft, non-tender; bowel sounds normal; no masses,  no organomegaly Extremities: extremities normal, atraumatic, no cyanosis or edema   Assessment/Plan: Doing well 2 days following abdominal myomectomy.  Postoperative anemia.  Ready for discharge.  LOS: 2 days   Leonard Schwartz M.D. Janine Limbo 12/11/2011, 10:28 AM

## 2011-12-11 NOTE — Discharge Instructions (Signed)
Myomectomy Myoma is a non-cancerous tumor made up of fibrous tissue. It is also called leiomyoma, but more often called a fibroid tumor. Myomectomy is the removal of a fibroid tumor without removing another organ, like the uterus or ovary, with it. Fibroids range from the size of a pea to a grapefruit. They are rarely cancerous. Myomas only need treatment when they are growing or when they cause symptoms, such aspain, pressure, bleeding, and pain with intercourse. LET YOUR CAREGIVER KNOW ABOUT:  Any allergies, especially to medicines.   If you develop a cold or an infection before your surgery.   Medicines taken, including vitamins, herbs, eyedrops, over-ther-counter medicines, and creams.   Use of steroids (by mouth or creams).   Previous problems with numbing medicines.   History of blood clots or other bleeding problems.   Other health problems, such as diabetes, kidney, heart, or lung problems.   Previous surgery.   Possibility of pregnancy, if this applies.  RISKS AND COMPLICATIONS   Excessive bleeding.   Infection.   Injury to other organs.   Blood clots in the legs, chest, and brain.   Scar tissue (adhesions) on other organs and in the pelvis.   Death during or after the surgery.  BEFORE THE PROCEDURE  Follow your caregiver's advice regarding your surgery and preparing for surgery.   Avoid taking aspirin or blood thinners as directed by your caregiver.   DO NOT eat or drink anything after midnight on the night before surgery, or as directed by your caregiver.   DO NOT smoke (if you smoke) for 2 weeks before the surgery.   DO NOT drink alcohol the day before the surgery.   If you are admitted the day of the surgery,arrive1 hour before your surgery is scheduled.   Arrange to have someone take you home from the hospital.   Arrange to have someone care for you when you go home.  PROCEDURE There are several ways to perform a myomectomy:  Hysteroscopy  myomectomy. A lighted tube is inserted inside the uterus. The tube will remove the fibroid. This is used when the fibroid is inside the cavity of the uterus.   Laparoscopic myomectomy. A long, lighted tube is inserted through 2 or 3 small incisions to see the organs in the pelvis. The fibroid is removed.   Myomectomy through a sugical cut (incicion) in the abdomen. The fibroid is removed through an incision made in the stomach. This way is performed when thethe fibroid cannot be removed with a hysteroscope or laprascope.  AFTER THE PROCEDURE  If you had laparoscopic or hysteroscopic myomectomy, you may go home the same day or stay overnight.   If you had abdominal myomectomy, you may stay in the hospital a few days.   Your intravenous (IV)access tube and catheter will be removed in 1 or 2 days.   If you stay in the hospital, your caregiver will order pain medicine and a sleeping pill, if needed.   You may be placed on an antibiotic medicine, if needed.   You may be given written instructions and medicines before you are sent home.  Document Released: 07/05/2007 Document Revised: 08/27/2011 Document Reviewed: 07/17/2009 [employer] Patient Information 2012 [employer]. 

## 2011-12-24 NOTE — Discharge Summary (Signed)
Physician Discharge Summary  Patient ID: CARMELITE VIOLET MRN: [id number] DOB/AGE: [birth year] 49 y.o.  Admit date: 12/09/2011 Discharge date: 12/24/2011  Admission Diagnoses: Fibroids, Abdominal Pain  Discharge Diagnoses: Fibroids, Abdominal Pain , Anemia          Discharged Condition:Stable  Hospital Course: On the date of admission, patient underwent an abdominal myomectomy removing 8 fibroids for an aggregate weight of 370 grams.  Patient tolerated procedure well. Post operative course was unremarkable, with patient tolerating a hemoglobin of 9.6.  By post operative day #2, patient had resumed bowel and bladder function and was therefore deemed ready for discharge home.    Disposition: 01-Home or Self Care  Discharge Orders    Future Appointments: Provider: Department: Dept Phone: Center:   01/19/2012 9:45 AM Kirkland Hun, MD [facility] [phone] None     Medication List  As of 12/24/2011  8:00 PM   TAKE these medications         azelastine 137 MCG/SPRAY nasal spray   Commonly known as: ASTELIN   Place 1 spray into the nose 2 (two) times daily. Alternates with Nasacort      enalapril 5 MG tablet   Commonly known as: VASOTEC   Take 5 mg by mouth every morning.      ferrous sulfate 325 (65 FE) MG tablet   Take 1 tablet (325 mg total) by mouth 3 (three) times daily with meals.      montelukast 10 MG tablet   Commonly known as: SINGULAIR   Take 10 mg by mouth at bedtime.      OVER THE COUNTER MEDICATION   Take 1 capsule by mouth every morning. Raspberry ketones      pravastatin 40 MG tablet   Commonly known as: PRAVACHOL   Take 40 mg by mouth every morning.      triamcinolone 55 MCG/ACT nasal inhaler   Commonly known as: NASACORT   Place 1 spray into the nose daily. Alternates with Astelin           Follow-up Information    Follow up with Dr. Stefano Gaul in 6 weeks.         SignedHenreitta Leber, PA-C 12/24/2011, 8:00 PM

## 2012-01-19 ENCOUNTER — Ambulatory Visit (INDEPENDENT_AMBULATORY_CARE_PROVIDER_SITE_OTHER): Payer: BC Managed Care – PPO | Admitting: Obstetrics and Gynecology

## 2012-01-19 ENCOUNTER — Encounter: Payer: Self-pay | Admitting: Obstetrics and Gynecology

## 2012-01-19 VITALS — BP 132/80 | Temp 98.0°F | Resp 16 | Ht 62.0 in | Wt 172.0 lb

## 2012-01-19 DIAGNOSIS — E669 Obesity, unspecified: Secondary | ICD-10-CM

## 2012-01-19 DIAGNOSIS — R102 Pelvic and perineal pain: Secondary | ICD-10-CM

## 2012-01-19 DIAGNOSIS — D259 Leiomyoma of uterus, unspecified: Secondary | ICD-10-CM

## 2012-01-19 DIAGNOSIS — N949 Unspecified condition associated with female genital organs and menstrual cycle: Secondary | ICD-10-CM

## 2012-01-19 DIAGNOSIS — D219 Benign neoplasm of connective and other soft tissue, unspecified: Secondary | ICD-10-CM

## 2012-01-19 NOTE — Progress Notes (Signed)
  Pain: yes  Date of Surgery: 12/09/2011  Vaginal Bleeding: no  Vaginal Discharge: no  Normal GU Functn: no  Normal GI Functn: no  Type of Surgery: Myomectomy  [name] is a 51 y.o. year old female,No obstetric history on file., who presents for a postoperative visit.  Subjective:  Feeling better.  Still some pelvic pain.  Not quite strong enough to return to work.  Objective:  BP 132/80  Temp 98 F (36.7 C)  Resp 16  Ht 5\' 2"  (1.575 m)  Wt 172 lb (78.019 kg)  BMI 31.46 kg/m2  LMP 12/22/2011   GI: soft, non-tender; bowel sounds normal; no masses,  no organomegaly and incision: clean, dry, intact and healing  External genitalia: normal general appearance Vaginal: normal without tenderness, induration or masses Cervix: normal appearance Adnexa: normal bimanual exam Uterus: upper limits normal size  Assessment:  Status post myomectomy: Slow recovery.  Plan:  Return to work on Monday, Jan 25, 2012.  Return to office for annual exam.  Increase activity slowly until she is back to normal functions.   Leonard Schwartz M.D.  01/19/2012 9:23 PM

## 2012-03-11 ENCOUNTER — Telehealth: Payer: Self-pay | Admitting: Obstetrics and Gynecology

## 2012-03-14 ENCOUNTER — Ambulatory Visit (INDEPENDENT_AMBULATORY_CARE_PROVIDER_SITE_OTHER): Payer: BC Managed Care – PPO | Admitting: Obstetrics and Gynecology

## 2012-03-14 ENCOUNTER — Encounter: Payer: Self-pay | Admitting: Obstetrics and Gynecology

## 2012-03-14 VITALS — BP 110/66 | Temp 98.2°F | Resp 14 | Ht 62.0 in | Wt 170.0 lb

## 2012-03-14 DIAGNOSIS — N949 Unspecified condition associated with female genital organs and menstrual cycle: Secondary | ICD-10-CM

## 2012-03-14 DIAGNOSIS — R102 Pelvic and perineal pain: Secondary | ICD-10-CM

## 2012-03-14 DIAGNOSIS — Z9889 Other specified postprocedural states: Secondary | ICD-10-CM | POA: Insufficient documentation

## 2012-03-14 DIAGNOSIS — R109 Unspecified abdominal pain: Secondary | ICD-10-CM

## 2012-03-14 LAB — POCT URINALYSIS DIPSTICK
Ketones, UA: NEGATIVE
Leukocytes, UA: NEGATIVE
Nitrite, UA: NEGATIVE
Urobilinogen, UA: NEGATIVE

## 2012-03-14 NOTE — Telephone Encounter (Signed)
done

## 2012-03-14 NOTE — Progress Notes (Signed)
HISTORY OF PRESENT ILLNESS  [name] is a 51 y.o. year old female,No obstetric history on file., who presents for a problem visit. The patient had a myomectomy performed on December 09, 2011.  She reports that she has had a vague left mid abdominal discomfort since that time.  Subjective:  The patient complains of pain in her left midabdomen upon palpation.  She complains of discomfort in the left mid abdomen when she needs to void.  Her pain is daily.  She denies GI and GU complaints.  Objective:  BP 110/66  Temp 98.2 F (36.8 C)  Resp 14  Ht 5\' 2"  (1.575 m)  Wt 170 lb (77.111 kg)  BMI 31.09 kg/m2  LMP 02/20/2012   General: alert and cooperative GI: soft, non-tender; bowel sounds normal; no masses,  no organomegaly  External genitalia: normal general appearance Vaginal: normal mucosa without prolapse or lesions Cervix: normal appearance and nontender Adnexa: normal bimanual exam and tenderness in the left mid abdomen with exam Uterus: upper limits normal size, tenderness in the left midabdomen with exam  Urine analysis: Trace protein, 1+ red blood cells   Assessment:  Status post myomectomy in March of 2013 Persistent left mid abdominal pain of uncertain etiology  Plan:  Ultrasound of the pelvis Urine culture  Return to office in 10 day(s).   Leonard Schwartz M.D.  03/14/2012 6:51 PM

## 2012-03-15 LAB — URINE CULTURE: Colony Count: NO GROWTH

## 2012-03-21 ENCOUNTER — Encounter: Payer: BC Managed Care – PPO | Admitting: Obstetrics and Gynecology

## 2012-04-06 ENCOUNTER — Ambulatory Visit (INDEPENDENT_AMBULATORY_CARE_PROVIDER_SITE_OTHER): Payer: BC Managed Care – PPO | Admitting: Obstetrics and Gynecology

## 2012-04-06 ENCOUNTER — Other Ambulatory Visit: Payer: Self-pay | Admitting: Obstetrics and Gynecology

## 2012-04-06 ENCOUNTER — Ambulatory Visit (INDEPENDENT_AMBULATORY_CARE_PROVIDER_SITE_OTHER): Payer: BC Managed Care – PPO

## 2012-04-06 ENCOUNTER — Encounter: Payer: Self-pay | Admitting: Obstetrics and Gynecology

## 2012-04-06 VITALS — BP 140/82 | HR 16 | Temp 98.6°F | Ht 62.0 in | Wt 173.0 lb

## 2012-04-06 DIAGNOSIS — R102 Pelvic and perineal pain: Secondary | ICD-10-CM

## 2012-04-06 DIAGNOSIS — R51 Headache: Secondary | ICD-10-CM

## 2012-04-06 DIAGNOSIS — N949 Unspecified condition associated with female genital organs and menstrual cycle: Secondary | ICD-10-CM

## 2012-04-06 DIAGNOSIS — R109 Unspecified abdominal pain: Secondary | ICD-10-CM

## 2012-04-06 DIAGNOSIS — R1110 Vomiting, unspecified: Secondary | ICD-10-CM

## 2012-04-06 NOTE — Progress Notes (Signed)
Vag. Discharge:no Odor:no Fever:no Irreg.Periods:yes Pt is unsure Dyspareunia:no Dysuria:no Frequency:no Urgency:no Hematuria:no Kidney stones:no Constipation:yes Diarrhea:no Rectal Bleeding: yes Vomiting:yes Nausea:yes Pregnant:no Fibroids:yes Endometriosis:no Hx of Ovarian Cyst:no Hx IUD:no Hx STD-PID:no Appendectomy:no Gall Bladder Dz:no HISTORY OF PRESENT ILLNESS  [name] is a 51 y.o. year old female,No obstetric history on file., who presents for a problem visit. The patient had an abdominal myomectomy in March of 2013.  The patient complains of a vague left abdominal discomfort.  She also complains of sinus congestion and a headache.  She complains of vomiting of bilious fluid.  Subjective:  Her pain is better than once was. The patient reports that her periods are not as regular as they used to be.  Objective:  BP 140/82  Pulse 16  Temp 98.6 F (37 C) (Oral)  Ht 5\' 2"  (1.575 m)  Wt 173 lb (78.472 kg)  BMI 31.64 kg/m2  LMP 03/07/2012   Congested sinuses General: alert and no distress Resp: clear to auscultation bilaterally Cardio: regular rate and rhythm, S1, S2 normal, no murmur, click, rub or gallop GI: soft, non-tender; bowel sounds normal; no masses,  no organomegaly  External genitalia: normal general appearance Vaginal: normal without tenderness, induration or masses Cervix: normal appearance Adnexa: normal bimanual exam Uterus: normal size shape and consistency  Ultrasound: Uterus 8 x 5.32 cm.  Endometrium 0.3 cm.  Ovaries normal.  One fibroids seen measuring 1.6 cm on the uterus.  Assessment:  Abdominal pain of uncertain etiology.  Better than it once was. Sinus headaches Early menopausal symptoms Vomiting.  Plan:  The patient was referred to her family physician and to a gastroenterologist. We will observe the patient's abdominal pain only for now.  Return to office prn if symptoms worsen or fail to improve.   Stacey Turner M.D.  04/07/2012 6:43 AM

## 2012-05-02 ENCOUNTER — Encounter: Payer: Self-pay | Admitting: Obstetrics and Gynecology

## 2012-05-02 ENCOUNTER — Ambulatory Visit (INDEPENDENT_AMBULATORY_CARE_PROVIDER_SITE_OTHER): Payer: BC Managed Care – PPO | Admitting: Obstetrics and Gynecology

## 2012-05-02 VITALS — BP 108/60 | Resp 14 | Ht 62.0 in | Wt 180.0 lb

## 2012-05-02 DIAGNOSIS — Z01419 Encounter for gynecological examination (general) (routine) without abnormal findings: Secondary | ICD-10-CM

## 2012-05-02 DIAGNOSIS — Z124 Encounter for screening for malignant neoplasm of cervix: Secondary | ICD-10-CM

## 2012-05-02 NOTE — Progress Notes (Signed)
Regular Periods: no Mammogram: yes  Monthly Breast Ex.: yes Exercise: yes  Tetanus < 10 years: no Seatbelts: yes  NI. Bladder Functn.: yes Abuse at home: no  Daily BM's: yes Stressful Work: yes  Healthy Diet: yes Sigmoid-Colonoscopy: "2007" 2 polyps were found. Test is  Due Now.   Calcium: no Medical problems this year: N/A    LAST PAP:04/28/2011 "WNL"  Contraception: None  Mammogram:  09/2011 "WNL"  PCP: Laurann Montana, MD   PMH: No Changes  FMH: No Changes  Last Bone Scan: Never  ANNUAL GYNECOLOGIC EXAMINATION   Natalie E Janco is a 51 y.o. female, No obstetric history on file., who presents for an annual exam. See above. The patient had an abdominal myomectomy in March of 2013.  Her menstrual cycles are better.  She does complain of fatigue.  She is being evaluated by her family physician.  Prior Hysterectomy: No    History   Social History  . Marital Status: Single    Spouse Name: N/A    Number of Children: N/A  . Years of Education: N/A   Social History Main Topics  . Smoking status: Never Smoker   . Smokeless tobacco: Never Used  . Alcohol Use: No  . Drug Use: No  . Sexually Active: Yes   Other Topics Concern  . None   Social History Narrative  . None    Menstrual cycle:   LMP: Patient's last menstrual period was 05/02/2012.             The following portions of the patient's history were reviewed and updated as appropriate: allergies, current medications, past family history, past medical history, past social history, past surgical history and problem list.  Review of Systems Pertinent items are noted in HPI. Breast:Negative for breast lump,nipple discharge or nipple retraction Gastrointestinal: Negative for abdominal pain, change in bowel habits or rectal bleeding Urinary:negative   Objective:    BP 108/60  Resp 14  Ht 5\' 2"  (1.575 m)  Wt 180 lb (81.647 kg)  BMI 32.92 kg/m2  LMP 05/02/2012    Weight:  Wt Readings from Last 1 Encounters:    05/02/12 180 lb (81.647 kg)          BMI: Body mass index is 32.92 kg/(m^2).  General Appearance: Alert, appropriate appearance for age. No acute distress HEENT: Grossly normal Neck / Thyroid: Supple, no masses, nodes or enlargement Lungs: clear to auscultation bilaterally Back: No CVA tenderness Breast Exam: No masses or nodes.No dimpling, nipple retraction or discharge. Cardiovascular: Regular rate and rhythm. S1, S2, no murmur Gastrointestinal: Soft, non-tender, no masses or organomegaly  ++++++++++++++++++++++++++++++++++++++++++++++++++++++++  Pelvic Exam: External genitalia: normal general appearance Vaginal: normal without tenderness, induration or masses and relaxation: Yes Cervix: normal appearance Adnexa: normal bimanual exam Uterus: normal size, shape, and consistency Rectovaginal: normal rectal, no masses  ++++++++++++++++++++++++++++++++++++++++++++++++++++++++  Lymphatic Exam: Non-palpable nodes in neck, clavicular, axillary, or inguinal regions Neurologic: Normal speech, no tremor  Psychiatric: Alert and oriented, appropriate affect.   Wet Prep:   not applicable Urinalysis:  not applicable UPT:           Not done   Assessment:    Normal gyn exam   Overweight or obese: Yes   Pelvic relaxation: Yes  Fatigue  Status post myomectomy and March 2013   Plan:    pap smear return annually or prn Contraception:abstinence    Medications prescribed: none  STD screen request: No   The updated Pap smear screening guidelines were  discussed with the patient. The patient requested that I obtain a Pap smear: Yes.  Kegel exercises discussed: No.  Proper diet and regular exercise were reviewed.  Annual mammograms recommended starting at age 35. Proper breast care was discussed.  Screening colonoscopy is recommended beginning at age 71.  Regular health maintenance was reviewed.  Sleep hygiene was discussed.  Adequate calcium and vitamin D intake was  emphasized.  Mylinda Latina.D.

## 2012-06-15 ENCOUNTER — Other Ambulatory Visit: Payer: Self-pay | Admitting: Gastroenterology

## 2012-06-15 DIAGNOSIS — R109 Unspecified abdominal pain: Secondary | ICD-10-CM

## 2012-06-15 DIAGNOSIS — R110 Nausea: Secondary | ICD-10-CM

## 2012-07-12 ENCOUNTER — Encounter (HOSPITAL_COMMUNITY): Payer: Self-pay

## 2012-07-12 ENCOUNTER — Ambulatory Visit (HOSPITAL_COMMUNITY)
Admission: RE | Admit: 2012-07-12 | Discharge: 2012-07-12 | Disposition: A | Discharge status: Home / Self Care | Payer: BC Managed Care – PPO | Source: Ambulatory Visit | Attending: Gastroenterology | Admitting: Gastroenterology

## 2012-07-12 ENCOUNTER — Encounter (HOSPITAL_COMMUNITY)
Admission: RE | Admit: 2012-07-12 | Discharge: 2012-07-12 | Disposition: A | Discharge status: Home / Self Care | Payer: BC Managed Care – PPO | Source: Ambulatory Visit | Attending: Gastroenterology | Admitting: Gastroenterology

## 2012-07-12 DIAGNOSIS — R109 Unspecified abdominal pain: Secondary | ICD-10-CM

## 2012-07-12 DIAGNOSIS — R110 Nausea: Secondary | ICD-10-CM | POA: Insufficient documentation

## 2012-07-12 MED ORDER — TECHNETIUM TC 99M MEBROFENIN IV KIT
5.5000 | PACK | Freq: Once | INTRAVENOUS | Status: AC | PRN
  Administered 2012-07-12: 5.5 via INTRAVENOUS

## 2012-12-09 ENCOUNTER — Other Ambulatory Visit: Payer: Self-pay | Admitting: Obstetrics and Gynecology

## 2012-12-09 DIAGNOSIS — Z1231 Encounter for screening mammogram for malignant neoplasm of breast: Secondary | ICD-10-CM

## 2012-12-27 ENCOUNTER — Ambulatory Visit (HOSPITAL_COMMUNITY)
Admission: RE | Admit: 2012-12-27 | Discharge: 2012-12-27 | Disposition: A | Discharge status: Home / Self Care | Payer: BC Managed Care – PPO | Source: Ambulatory Visit | Attending: Obstetrics and Gynecology | Admitting: Obstetrics and Gynecology

## 2012-12-27 DIAGNOSIS — Z1231 Encounter for screening mammogram for malignant neoplasm of breast: Secondary | ICD-10-CM | POA: Insufficient documentation

## 2013-07-04 ENCOUNTER — Other Ambulatory Visit: Payer: Self-pay | Admitting: Obstetrics and Gynecology

## 2013-07-18 ENCOUNTER — Other Ambulatory Visit (HOSPITAL_COMMUNITY): Payer: BC Managed Care – PPO

## 2013-07-31 ENCOUNTER — Other Ambulatory Visit (HOSPITAL_COMMUNITY): Payer: BC Managed Care – PPO

## 2013-08-22 ENCOUNTER — Other Ambulatory Visit (HOSPITAL_COMMUNITY): Payer: Self-pay | Admitting: Family Medicine

## 2013-08-22 ENCOUNTER — Ambulatory Visit (HOSPITAL_COMMUNITY): Payer: BC Managed Care – PPO | Attending: Family Medicine | Admitting: Radiology

## 2013-08-22 DIAGNOSIS — E669 Obesity, unspecified: Secondary | ICD-10-CM | POA: Insufficient documentation

## 2013-08-22 DIAGNOSIS — R011 Cardiac murmur, unspecified: Secondary | ICD-10-CM | POA: Insufficient documentation

## 2013-08-22 DIAGNOSIS — E785 Hyperlipidemia, unspecified: Secondary | ICD-10-CM | POA: Insufficient documentation

## 2013-08-22 DIAGNOSIS — I10 Essential (primary) hypertension: Secondary | ICD-10-CM | POA: Insufficient documentation

## 2013-08-22 DIAGNOSIS — I379 Nonrheumatic pulmonary valve disorder, unspecified: Secondary | ICD-10-CM | POA: Insufficient documentation

## 2013-08-22 DIAGNOSIS — I079 Rheumatic tricuspid valve disease, unspecified: Secondary | ICD-10-CM | POA: Insufficient documentation

## 2013-08-22 DIAGNOSIS — I059 Rheumatic mitral valve disease, unspecified: Secondary | ICD-10-CM | POA: Insufficient documentation

## 2013-08-22 NOTE — Progress Notes (Signed)
Echocardiogram performed.  

## 2013-10-06 ENCOUNTER — Encounter: Payer: Self-pay | Admitting: Cardiovascular Disease

## 2013-10-06 ENCOUNTER — Telehealth: Payer: Self-pay | Admitting: Cardiovascular Disease

## 2013-10-06 ENCOUNTER — Ambulatory Visit (INDEPENDENT_AMBULATORY_CARE_PROVIDER_SITE_OTHER): Payer: BC Managed Care – PPO | Admitting: Cardiovascular Disease

## 2013-10-06 VITALS — BP 140/78 | HR 80 | Ht 62.0 in | Wt 182.0 lb

## 2013-10-06 DIAGNOSIS — I10 Essential (primary) hypertension: Secondary | ICD-10-CM | POA: Insufficient documentation

## 2013-10-06 DIAGNOSIS — R002 Palpitations: Secondary | ICD-10-CM | POA: Insufficient documentation

## 2013-10-06 DIAGNOSIS — R011 Cardiac murmur, unspecified: Secondary | ICD-10-CM | POA: Insufficient documentation

## 2013-10-06 DIAGNOSIS — E785 Hyperlipidemia, unspecified: Secondary | ICD-10-CM | POA: Insufficient documentation

## 2013-10-06 NOTE — Assessment & Plan Note (Signed)
Patient has palpitations on a weekly basis. They break with coughing. She's not had any evidence of presyncope. I'm going to get an event monitor to further evaluate.

## 2013-10-06 NOTE — Telephone Encounter (Signed)
Please call-you just put a monitor on her and it is making some type of noise.

## 2013-10-06 NOTE — Assessment & Plan Note (Signed)
Patient was referred for a dilation of a cardiac murmur. A 2-D echo revealed normal LV systolic function with severe focal basal and mild concentric left hypertrophy. There is no valvular abnormalities and no apparent ocular track gradient. Pulmonary pressures were normal. I do not think that  she has obstructive cardiomyopathy.

## 2013-10-06 NOTE — Assessment & Plan Note (Signed)
Well-controlled on current medications 

## 2013-10-06 NOTE — Patient Instructions (Signed)
  We will see you back in follow up in 4-6 weeks with an extender  Dr Gwenlyn Found has ordered for you to wear an event monitor for 30 days.

## 2013-10-06 NOTE — Assessment & Plan Note (Signed)
On statin therapy thought by her PCP

## 2013-10-06 NOTE — Progress Notes (Signed)
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 She was referred by Dr. [name] for cardiovascular evaluation because of an auscultated murmur and palpitations. Her cardiovascular risk factor profile is notable for hypertension and hyperlipidemia but otherwise negative. She does have briefly diagnosed rheumatoid arthritis. There is no family history of heart disease. She is a nonsmoker she diabetic. She's never had a heart attack or stroke. She does complain of atypical stabbing chest pain which does not sound cardiac. Apparently she was diagnosed with a murmur and had a 2-D echocardiogram performed/2/14 revealed normal LV systolic function, normal valvular function, basal tubular hypertrophy with mild concentric hypertrophy. She does not have symptoms compatible HOCM.   Current Outpatient Prescriptions  Medication Sig Dispense Refill  . azelastine (ASTELIN) 137 MCG/SPRAY nasal spray Place 1 spray into the nose 2 (two) times daily. Alternates with Nasacort      . enalapril (VASOTEC) 5 MG tablet Take 5 mg by mouth every morning.      . montelukast (SINGULAIR) 10 MG tablet Take 10 mg by mouth at bedtime.      Marland Kitchen OVER THE COUNTER MEDICATION Take 1 capsule by mouth every morning. Raspberry ketones      . pravastatin (PRAVACHOL) 40 MG tablet Take 40 mg by mouth every morning.      . triamcinolone (NASACORT) 55 MCG/ACT nasal inhaler Place 1 spray into the nose daily. Alternates with Astelin       No current facility-administered medications for this visit.    Allergies  Allergen Reactions  . Latex Hives  . Penicillins     History   Social History  . Marital Status: Single    Spouse Name: N/A    Number of Children: N/A  .  Years of Education: N/A   Occupational History  . Not on file.   Social History Main Topics  . Smoking status: Never Smoker   . Smokeless tobacco: Never Used  . Alcohol Use: No  . Drug Use: No  . Sexual Activity: Yes   Other Topics Concern  . Not on file   Social History Narrative  . No narrative on file     Review of Systems: General: negative for chills, fever, night sweats or weight changes.  Cardiovascular: negative for chest pain, dyspnea on exertion, edema, orthopnea, palpitations, paroxysmal nocturnal dyspnea or shortness of breath Dermatological: negative for rash Respiratory: negative for cough or wheezing Urologic: negative for hematuria Abdominal: negative for nausea, vomiting, diarrhea, bright red blood per rectum, melena, or hematemesis Neurologic: negative for visual changes, syncope, or dizziness All other systems reviewed and are otherwise negative except as noted above.    Blood pressure 140/78, pulse 80, height 5\' 2"  (1.575 m), weight 182 lb (82.555 kg).  General appearance: alert and no distress Neck: no adenopathy, no carotid bruit, no JVD, supple, symmetrical, trachea midline and thyroid not enlarged, symmetric, no tenderness/mass/nodules Lungs: clear to auscultation bilaterally Heart: regular rate and rhythm, S1, S2 normal, no murmur, click, rub or gallop Abdomen: soft, non-tender; bowel sounds normal; no masses,  no organomegaly Extremities: extremities normal, atraumatic, no cyanosis or edema Pulses: 2+ and symmetric  EKG normal sinus rhythm at 78 with no ST or T wave changes  ASSESSMENT AND PLAN:  Cardiac murmur Patient was referred for a dilation of a cardiac murmur. A 2-D echo revealed normal LV systolic function with severe focal basal and mild concentric left hypertrophy. There is no valvular abnormalities and no apparent ocular track gradient. Pulmonary pressures were normal. I do not think that  she has obstructive  cardiomyopathy.  Essential hypertension Well-controlled on current medications  Hyperlipidemia On statin therapy thought by her PCP  Palpitations Patient has palpitations on a weekly basis. They break with coughing. She's not had any evidence of presyncope. I'm going to get an event monitor to further evaluate.      Lorretta Harp MD FACP,FACC,FAHA, [facility] 10/06/2013 8:15 AM

## 2013-11-17 ENCOUNTER — Ambulatory Visit: Payer: BC Managed Care – PPO | Admitting: Cardiology

## 2013-11-21 ENCOUNTER — Other Ambulatory Visit: Payer: Self-pay | Admitting: Obstetrics and Gynecology

## 2013-11-21 DIAGNOSIS — Z1231 Encounter for screening mammogram for malignant neoplasm of breast: Secondary | ICD-10-CM

## 2013-12-28 ENCOUNTER — Ambulatory Visit (HOSPITAL_COMMUNITY): Payer: BC Managed Care – PPO | Attending: Obstetrics and Gynecology

## 2014-01-09 ENCOUNTER — Ambulatory Visit (HOSPITAL_COMMUNITY)
Admission: RE | Admit: 2014-01-09 | Discharge: 2014-01-09 | Disposition: A | Discharge status: Home / Self Care | Payer: BC Managed Care – PPO | Source: Ambulatory Visit | Attending: Obstetrics and Gynecology | Admitting: Obstetrics and Gynecology

## 2014-01-09 DIAGNOSIS — Z1231 Encounter for screening mammogram for malignant neoplasm of breast: Secondary | ICD-10-CM

## 2014-01-11 ENCOUNTER — Other Ambulatory Visit: Payer: Self-pay | Admitting: Obstetrics and Gynecology

## 2014-01-11 DIAGNOSIS — R928 Other abnormal and inconclusive findings on diagnostic imaging of breast: Secondary | ICD-10-CM

## 2014-01-18 ENCOUNTER — Ambulatory Visit: Payer: BC Managed Care – PPO | Admitting: Cardiology

## 2014-01-19 ENCOUNTER — Ambulatory Visit
Admission: RE | Admit: 2014-01-19 | Discharge: 2014-01-19 | Disposition: A | Discharge status: Home / Self Care | Payer: BC Managed Care – PPO | Source: Ambulatory Visit | Attending: Obstetrics and Gynecology | Admitting: Obstetrics and Gynecology

## 2014-01-19 DIAGNOSIS — R928 Other abnormal and inconclusive findings on diagnostic imaging of breast: Secondary | ICD-10-CM

## 2014-02-08 ENCOUNTER — Ambulatory Visit (INDEPENDENT_AMBULATORY_CARE_PROVIDER_SITE_OTHER): Payer: BC Managed Care – PPO | Admitting: Cardiology

## 2014-02-08 ENCOUNTER — Encounter: Payer: Self-pay | Admitting: Cardiology

## 2014-02-08 VITALS — BP 124/82 | HR 80 | Ht 62.0 in | Wt 180.8 lb

## 2014-02-08 DIAGNOSIS — I10 Essential (primary) hypertension: Secondary | ICD-10-CM

## 2014-02-08 DIAGNOSIS — R011 Cardiac murmur, unspecified: Secondary | ICD-10-CM

## 2014-02-08 NOTE — Patient Instructions (Signed)
Your physician recommends that you continue on your current medications as directed. Please refer to the Current Medication list given to you today.  Your physician recommends that you schedule a follow-up as needed. 

## 2014-02-08 NOTE — Progress Notes (Signed)
Stacey Turner. [address], [address], [place]  [address] Phone: [phone] Fax:  [phone]  Date:  02/08/2014   ID:  Stacey Turner, DOB 07/05/1961, MRN [id number]  PCP:  Stacey Schwalbe, MD   History of Present Illness: Stacey Turner is a 53 y.o. female here for evaluation of abnormal echo. Head basal septal hypertrophy. Normal ejection fraction. Mild pulmonary hypertension, mild TR. She occasionally has palpitations. She has worn an event monitor in the past which has been unremarkable. Occasionally she feels as though she needs to tap her chest to get a full deep breath in. I also take care of her sister Stacey Turner. Has significant arthritis. Rheumatoid arthritis. Mother recently died after Christmas. LDL 95   Wt Readings from Last 3 Encounters:  02/08/14 180 lb 12.8 oz (82.01 kg)  10/06/13 182 lb (82.555 kg)  05/02/12 180 lb (81.647 kg)     Past Medical History  Diagnosis Date  . Hypertension   . Headache(784.0)   . PONV (postoperative nausea and vomiting)   . Irregular periods/menstrual cycles 1999  . Fibroid uterus 2001  . H/O dysmenorrhea 2001  . H/O: menorrhagia 2001  . Monilial vaginitis 2003  . 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 High cholesterol 2003  . Stress 2004  . Yeast vaginitis 2004  . H/O hemorrhoids 2006  . Sinusitis 2009  . H/O abdominal pain 04/24/2011  . Pelvic pain in female 11/25/2011  . Palpitations   . Cardiac murmur     Past Surgical History  Procedure Laterality Date  . Ganglion cyst excision    . Bunionectomy    . Myomectomy  12/09/2011    Procedure: MYOMECTOMY;  Surgeon: Stacey Dawley, MD;  Location: [facility];  Service: Gynecology;  Laterality: N/A;  Abdominal Myomectomy, Biopsy abdominal Myometrium    Current Outpatient Prescriptions  Medication Sig Dispense Refill  . azelastine (ASTELIN) 137 MCG/SPRAY nasal spray Place 1 spray into the nose 2 (two) times daily. Alternates with Nasacort      . enalapril (VASOTEC) 5 MG tablet Take 5 mg by  mouth every morning.      . hydroxychloroquine (PLAQUENIL) 200 MG tablet Take 200 mg by mouth 2 (two) times daily.      . montelukast (SINGULAIR) 10 MG tablet Take 10 mg by mouth at bedtime.      Marland Kitchen OVER THE COUNTER MEDICATION Take 1 capsule by mouth every morning. Raspberry ketones      . pravastatin (PRAVACHOL) 40 MG tablet Take 40 mg by mouth every morning.      . triamcinolone (NASACORT) 55 MCG/ACT nasal inhaler Place 1 spray into the nose daily. Alternates with Astelin       No current facility-administered medications for this visit.    Allergies:    Allergies  Allergen Reactions  . Latex Hives  . Penicillins     Social History:  The patient  reports that she has never smoked. She has never used smokeless tobacco. She reports that she does not drink alcohol or use illicit drugs.   Family History  Problem Relation Age of Onset  . Cancer Father     Colon  . Heart failure Mother     ROS:  Please see the history of present illness.   Denies any syncope, bleeding, orthopnea, PND. Occasional arthralgias. Transient shortness of breath at times. Anxiety.  All other systems reviewed and negative.   PHYSICAL EXAM: VS:  BP 124/82  Pulse 80  Ht 5'  2" (1.575 m)  Wt 180 lb 12.8 oz (82.01 kg)  BMI 33.06 kg/m2  LMP 12/27/2013 Well nourished, well developed, in no acute distress HEENT: normal, [place]/AT, EOMI Neck: no JVD, normal carotid upstroke, no bruit Cardiac:  normal S1, S2; RRR; systolic 2/6 RUSB/LUSB murmur Lungs:  clear to auscultation bilaterally, no wheezing, rhonchi or rales Abd: soft, nontender, no hepatomegaly, no bruitsoverweight. Ext: no edema, 2+ distal pulses Skin: warm and dry GU: deferred Neuro: no focal abnormalities noted, AAO x 3  EKG:    ECHO: 08/22/13 - Left ventricle: The cavity size was normal. There was severe focal basal and mild concentric hypertrophy. Systolic function was normal. The estimated ejection fraction was 55%. Wall motion was normal; there  were no regional wall motion abnormalities. - Mitral valve: Mild regurgitation. - Left atrium: The atrium was mildly dilated. - Pulmonary arteries: PA peak pressure: 91mm Hg (S). Impressions:  - The right ventricular systolic pressure was increased consistent with mild pulmonary hypertension.   ASSESSMENT AND PLAN:  1. Heart murmur-possibly a combination of tricuspid regurgitation as well as flow murmur with her septal basal hypertrophy. We could consider low-dose carvedilol 3.125 mg twice a day for starters however at this time she's not interested in taking an extra medication. This is OK. If her palpitations worsen, this would be a good medicine to initiate. This would help with relaxation of the heart as well. 2. Hypertension-well-controlled with low-dose enalapril. 3. I'll be happy to see her back on as-needed basis. She would like to follow up with Dr. Dema Turner as usual.  Signed, Stacey Furbish, MD [facility]  02/08/2014 3:19 PM

## 2014-05-31 ENCOUNTER — Other Ambulatory Visit: Payer: Self-pay | Admitting: Obstetrics and Gynecology

## 2014-05-31 DIAGNOSIS — R921 Mammographic calcification found on diagnostic imaging of breast: Secondary | ICD-10-CM

## 2014-06-11 ENCOUNTER — Other Ambulatory Visit: Payer: Self-pay | Admitting: Obstetrics and Gynecology

## 2014-07-24 ENCOUNTER — Inpatient Hospital Stay: Admission: RE | Admit: 2014-07-24 | Payer: BC Managed Care – PPO | Source: Ambulatory Visit

## 2014-09-04 ENCOUNTER — Ambulatory Visit
Admission: RE | Admit: 2014-09-04 | Discharge: 2014-09-04 | Disposition: A | Discharge status: Home / Self Care | Payer: BC Managed Care – PPO | Source: Ambulatory Visit | Attending: Obstetrics and Gynecology | Admitting: Obstetrics and Gynecology

## 2014-09-04 DIAGNOSIS — R921 Mammographic calcification found on diagnostic imaging of breast: Secondary | ICD-10-CM

## 2014-09-21 HISTORY — PX: BREAST BIOPSY: SHX20

## 2015-02-28 ENCOUNTER — Other Ambulatory Visit: Payer: Self-pay | Admitting: Obstetrics and Gynecology

## 2015-02-28 DIAGNOSIS — R921 Mammographic calcification found on diagnostic imaging of breast: Secondary | ICD-10-CM

## 2015-03-08 ENCOUNTER — Ambulatory Visit
Admission: RE | Admit: 2015-03-08 | Discharge: 2015-03-08 | Disposition: A | Discharge status: Home / Self Care | Payer: BLUE CROSS/BLUE SHIELD | Source: Ambulatory Visit | Attending: Obstetrics and Gynecology | Admitting: Obstetrics and Gynecology

## 2015-03-08 DIAGNOSIS — R921 Mammographic calcification found on diagnostic imaging of breast: Secondary | ICD-10-CM

## 2016-02-26 ENCOUNTER — Other Ambulatory Visit: Payer: Self-pay | Admitting: Obstetrics and Gynecology

## 2016-02-26 DIAGNOSIS — R921 Mammographic calcification found on diagnostic imaging of breast: Secondary | ICD-10-CM

## 2016-03-17 ENCOUNTER — Other Ambulatory Visit: Payer: Self-pay | Admitting: Obstetrics and Gynecology

## 2016-03-17 ENCOUNTER — Ambulatory Visit
Admission: RE | Admit: 2016-03-17 | Discharge: 2016-03-17 | Disposition: A | Discharge status: Home / Self Care | Payer: BLUE CROSS/BLUE SHIELD | Source: Ambulatory Visit | Attending: Obstetrics and Gynecology | Admitting: Obstetrics and Gynecology

## 2016-03-17 DIAGNOSIS — R921 Mammographic calcification found on diagnostic imaging of breast: Secondary | ICD-10-CM

## 2016-03-26 ENCOUNTER — Other Ambulatory Visit: Payer: Self-pay | Admitting: Obstetrics and Gynecology

## 2016-03-26 DIAGNOSIS — R921 Mammographic calcification found on diagnostic imaging of breast: Secondary | ICD-10-CM

## 2016-03-27 ENCOUNTER — Ambulatory Visit
Admission: RE | Admit: 2016-03-27 | Discharge: 2016-03-27 | Disposition: A | Discharge status: Home / Self Care | Payer: BLUE CROSS/BLUE SHIELD | Source: Ambulatory Visit | Attending: Obstetrics and Gynecology | Admitting: Obstetrics and Gynecology

## 2016-03-27 DIAGNOSIS — R921 Mammographic calcification found on diagnostic imaging of breast: Secondary | ICD-10-CM

## 2016-06-17 ENCOUNTER — Other Ambulatory Visit: Payer: Self-pay | Admitting: Obstetrics and Gynecology

## 2016-07-07 ENCOUNTER — Ambulatory Visit (INDEPENDENT_AMBULATORY_CARE_PROVIDER_SITE_OTHER): Payer: BLUE CROSS/BLUE SHIELD | Admitting: Rheumatology

## 2016-07-07 DIAGNOSIS — F329 Major depressive disorder, single episode, unspecified: Secondary | ICD-10-CM

## 2016-07-07 DIAGNOSIS — M7072 Other bursitis of hip, left hip: Secondary | ICD-10-CM

## 2016-07-07 DIAGNOSIS — Z79899 Other long term (current) drug therapy: Secondary | ICD-10-CM | POA: Diagnosis not present

## 2016-07-07 DIAGNOSIS — M0579 Rheumatoid arthritis with rheumatoid factor of multiple sites without organ or systems involvement: Secondary | ICD-10-CM

## 2016-07-07 DIAGNOSIS — G4709 Other insomnia: Secondary | ICD-10-CM

## 2016-07-24 DIAGNOSIS — Z23 Encounter for immunization: Secondary | ICD-10-CM | POA: Diagnosis not present

## 2016-07-24 DIAGNOSIS — J309 Allergic rhinitis, unspecified: Secondary | ICD-10-CM | POA: Diagnosis not present

## 2016-07-24 DIAGNOSIS — I10 Essential (primary) hypertension: Secondary | ICD-10-CM | POA: Diagnosis not present

## 2016-07-24 DIAGNOSIS — E785 Hyperlipidemia, unspecified: Secondary | ICD-10-CM | POA: Diagnosis not present

## 2016-09-02 ENCOUNTER — Other Ambulatory Visit: Payer: Self-pay | Admitting: Obstetrics and Gynecology

## 2016-09-02 DIAGNOSIS — N950 Postmenopausal bleeding: Secondary | ICD-10-CM | POA: Diagnosis not present

## 2016-09-02 DIAGNOSIS — D259 Leiomyoma of uterus, unspecified: Secondary | ICD-10-CM | POA: Diagnosis not present

## 2016-09-02 DIAGNOSIS — N719 Inflammatory disease of uterus, unspecified: Secondary | ICD-10-CM | POA: Diagnosis not present

## 2016-11-23 DIAGNOSIS — R21 Rash and other nonspecific skin eruption: Secondary | ICD-10-CM | POA: Diagnosis not present

## 2016-12-10 ENCOUNTER — Ambulatory Visit: Payer: BLUE CROSS/BLUE SHIELD | Admitting: Rheumatology

## 2016-12-30 ENCOUNTER — Telehealth: Payer: Self-pay | Admitting: Rheumatology

## 2016-12-30 NOTE — Telephone Encounter (Signed)
Attempted to contact patient and left message for patient to call the office.  

## 2016-12-30 NOTE — Telephone Encounter (Signed)
Patient is schedule for May 1 @ 2:45, she would like to get a [place] during that visit.  SE#[phone]

## 2017-01-01 ENCOUNTER — Other Ambulatory Visit: Payer: Self-pay | Admitting: *Deleted

## 2017-01-01 NOTE — Telephone Encounter (Signed)
Attempted to contact the patient and left message for patient to call the office.  

## 2017-01-01 NOTE — Telephone Encounter (Signed)
Patient returned your call.

## 2017-01-04 ENCOUNTER — Ambulatory Visit: Payer: BLUE CROSS/BLUE SHIELD | Admitting: Rheumatology

## 2017-01-05 DIAGNOSIS — M7062 Trochanteric bursitis, left hip: Secondary | ICD-10-CM

## 2017-01-05 DIAGNOSIS — M25552 Pain in left hip: Secondary | ICD-10-CM

## 2017-01-05 DIAGNOSIS — M25551 Pain in right hip: Secondary | ICD-10-CM | POA: Insufficient documentation

## 2017-01-05 DIAGNOSIS — Z79899 Other long term (current) drug therapy: Secondary | ICD-10-CM | POA: Insufficient documentation

## 2017-01-05 DIAGNOSIS — M0609 Rheumatoid arthritis without rheumatoid factor, multiple sites: Secondary | ICD-10-CM | POA: Insufficient documentation

## 2017-01-05 DIAGNOSIS — M7061 Trochanteric bursitis, right hip: Secondary | ICD-10-CM | POA: Insufficient documentation

## 2017-01-05 NOTE — Progress Notes (Signed)
Office Visit Note  Patient: Stacey Turner             Date of Birth: [birth year]           MRN: [id number]             PCP: Vidal Schwalbe, MD Referring: [name], MD Visit Date: 01/19/2017 Occupation: '@GUAROCC' @    Subjective:  Pain hands.   History of Present Illness: Stacey Turner is a 56 y.o. female with history of rheumatoid arthritis some. She states she has been doing fairly well with no  joint swelling. She has had discomfort in her left hand off and on. She continues to have some discomfort in trochanteric bursa.  Activities of Daily Living:  Patient reports morning stiffness for 0 minute.   Patient Denies nocturnal pain.  Difficulty dressing/grooming: Denies Difficulty climbing stairs: Denies Difficulty getting out of chair: Denies Difficulty using hands for taps, buttons, cutlery, and/or writing: Denies   Review of Systems  Constitutional: Negative for fatigue, night sweats, weight gain, weight loss and weakness.  HENT: Negative for mouth sores, trouble swallowing, trouble swallowing, mouth dryness and nose dryness.   Eyes: Negative for pain, redness, visual disturbance and dryness.  Respiratory: Negative for cough, shortness of breath and difficulty breathing.   Cardiovascular: Negative for chest pain, palpitations, hypertension, irregular heartbeat and swelling in legs/feet.  Gastrointestinal: Negative for blood in stool, constipation and diarrhea.  Endocrine: Negative for increased urination.  Genitourinary: Negative for vaginal dryness.  Musculoskeletal: Positive for arthralgias, joint pain and morning stiffness. Negative for joint swelling, myalgias, muscle weakness, muscle tenderness and myalgias.  Skin: Negative for color change, rash, hair loss, skin tightness, ulcers and sensitivity to sunlight.  Allergic/Immunologic: Negative for susceptible to infections.  Neurological: Negative for dizziness, memory loss and night sweats.  Hematological: Negative  for swollen glands.  Psychiatric/Behavioral: Positive for sleep disturbance. Negative for depressed mood. The patient is not nervous/anxious.     PMFS History:  Patient Active Problem List   Diagnosis Date Noted  . Primary insomnia 01/16/2017  . Rheumatoid arthritis of multiple sites with negative rheumatoid factor ([place]) 01/05/2017  . High risk medication use 01/05/2017  . Trochanteric bursitis of both hips 01/05/2017  . Palpitations 10/06/2013  . Cardiac murmur 10/06/2013  . Essential hypertension 10/06/2013  . Hyperlipidemia 10/06/2013  . S/P myomectomy 03/14/2012  . Obesity 01/19/2012  . Fibroids 01/19/2012  . Pelvic pain 01/19/2012    Past Medical History:  Diagnosis Date  . Cardiac murmur   . Fibroid uterus 2001  . H/O abdominal pain 04/24/2011  . H/O dysmenorrhea 2001  . H/O hemorrhoids 2006  . H/O: menorrhagia 2001  . Headache(784.0)   . High cholesterol 2003  . 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 Hypertension   . Irregular periods/menstrual cycles 1999  . Monilial vaginitis 2003  . Palpitations   . Pelvic pain in female 11/25/2011  . PONV (postoperative nausea and vomiting)   . Sinusitis 2009  . Stress 2004  . Yeast vaginitis 2004    Family History  Problem Relation Age of Onset  . Heart failure Mother   . Cancer Father     Colon   Past Surgical History:  Procedure Laterality Date  . BUNIONECTOMY    . GANGLION CYST EXCISION    . MYOMECTOMY  12/09/2011   Procedure: MYOMECTOMY;  Surgeon: Ena Dawley, MD;  Location: [facility];  Service: Gynecology;  Laterality: N/A;  Abdominal Myomectomy, Biopsy abdominal Myometrium   Social  History   Social History Narrative  . No narrative on file     Objective: Vital Signs: BP (!) 150/75 (BP Location: Left Arm, Patient Position: Sitting, Cuff Size: Normal)   Pulse 91   Resp 13   Ht '5\' 2"'  (1.575 m)   Wt 196 lb (88.9 kg)   LMP 12/27/2013 Comment: perimenopausa;  BMI 35.85 kg/m    Physical Exam  Constitutional: She is  oriented to person, place, and time. She appears well-developed and well-nourished.  HENT:  Head: Normocephalic and atraumatic.  Eyes: Conjunctivae and EOM are normal.  Neck: Normal range of motion.  Cardiovascular: Normal rate, regular rhythm, normal heart sounds and intact distal pulses.   Pulmonary/Chest: Effort normal and breath sounds normal.  Abdominal: Soft. Bowel sounds are normal.  Lymphadenopathy:    She has no cervical adenopathy.  Neurological: She is alert and oriented to person, place, and time.  Skin: Skin is warm and dry. Capillary refill takes less than 2 seconds.  Psychiatric: She has a normal mood and affect. Her behavior is normal.  Nursing note and vitals reviewed.    Musculoskeletal Exam: C-spine and thoracic lumbar spine good range of motion. Shoulder joints elbow joints wrist joint MCPs PIPs DIPs with good range of motion with no synovitis. Hip joints knee joints ankles MTPs PIPs DIPs with good range of motion with no synovitis. She has some tenderness over bilateral trochanteric area consistent with trochanteric bursitis.  CDAI Exam: CDAI Homunculus Exam:   Joint Counts:  CDAI Tender Joint count: 0 CDAI Swollen Joint count: 0  Global Assessments:  Patient Global Assessment: 2 Provider Global Assessment: 2  CDAI Calculated Score: 4    Investigation: Findings:  Her eye exam in May 2017 was normal, per patient  07/07/2016 CMP normal CBC normal except for hemoglobin of 11.0  August 2014:  CBC showed hemoglobin of 11.8.  Comprehensive metabolic panel, sed rate, hepatitis panel, G6PD, TB Gold, ACE level, SPEP, CCP, HLA-B27 were all within normal limits.  CK was slightly elevated at 185, which is not unusual for the African American population.  Her immunoglobin IgM was low at 49.          Imaging: [place] Extrem Up Bilat Comp  Result Date: 01/19/2017 Ultrasound examination of bilateral hands was performed per EULAR recommendations. Using 12 MHz  transducer, grayscale and power Doppler bilateral second, third, and fifth MCP joints and bilateral wrist joints both dorsal and volar aspects were evaluated to look for synovitis or tenosynovitis. The findings were there was no synovitis or tenosynovitis on ultrasound examination. Synovial thickening was noted in right second MCP joint.. Right median nerve was 0.13 cm squares which was more than upper limits of normal and left median nerve was 0.11 cm squares which was within normal limits. Impression: Ultrasound examination did not reveal any active synovitis. Right median nerve was enlarged. She had no symptoms of carpal tunnel syndrome.   Speciality Comments: No specialty comments available.    Procedures:  No procedures performed Allergies: Latex and Penicillins   Assessment / Plan:     Visit Diagnoses: Rheumatoid arthritis of multiple sites with negative rheumatoid factor [facility]): Patient complains of some arthralgias but she has no synovitis on examination. Ultrasound performed today did not reveal any synovitis. We'll continue with current treatment for now.  High risk medication use - Plaquenil 200 mg by mouth twice a day Monday to Friday. We will check labs today and refill her Plaquenil for her request. - Plan: CBC  with Differential/Platelet, COMPLETE METABOLIC PANEL WITH GFR  Trochanteric bursitis of both hips: ITB and exercises demonstrated and discussed.  History of hypertension: Her blood pressure is elevated today advised her to monitor it and follow-up with PCP.  Primary insomnia  Mixed hyperlipidemia : She is following up with PCP    Orders: Orders Placed This Encounter  Procedures  . [place] Extrem Up Bilat Comp  . CBC with Differential/Platelet  . COMPLETE METABOLIC PANEL WITH GFR   Meds ordered this encounter  Medications  . hydroxychloroquine (PLAQUENIL) 200 MG tablet    Sig: Take 1 tablet (200 mg total) by mouth 2 (two) times daily.    Dispense:  90 tablet     Refill:  1    Face-to-face time spent with patient was 30 minutes. 50% of time was spent in counseling and coordination of care.  Follow-Up Instructions: Return in about 5 months (around 06/21/2017) for Rheumatoid arthritis.   Bo Merino, MD  Note - This record has been created using Editor, commissioning.  Chart creation errors have been sought, but may not always  have been located. Such creation errors do not reflect on  the standard of medical care.

## 2017-01-16 DIAGNOSIS — F5101 Primary insomnia: Secondary | ICD-10-CM | POA: Insufficient documentation

## 2017-01-18 ENCOUNTER — Telehealth: Payer: Self-pay | Admitting: Rheumatology

## 2017-01-18 DIAGNOSIS — Z79899 Other long term (current) drug therapy: Secondary | ICD-10-CM

## 2017-01-18 NOTE — Telephone Encounter (Signed)
Patient going to gp in the am (8:15 am), needs lab orders sent to their office. GP Dr. [name] with [facility].

## 2017-01-18 NOTE — Telephone Encounter (Signed)
Sent to PCP ?

## 2017-01-19 ENCOUNTER — Encounter: Payer: Self-pay | Admitting: Rheumatology

## 2017-01-19 ENCOUNTER — Ambulatory Visit (INDEPENDENT_AMBULATORY_CARE_PROVIDER_SITE_OTHER): Payer: BLUE CROSS/BLUE SHIELD | Admitting: Rheumatology

## 2017-01-19 ENCOUNTER — Inpatient Hospital Stay (INDEPENDENT_AMBULATORY_CARE_PROVIDER_SITE_OTHER): Payer: Self-pay

## 2017-01-19 VITALS — BP 150/75 | HR 91 | Resp 13 | Ht 62.0 in | Wt 196.0 lb

## 2017-01-19 DIAGNOSIS — M79641 Pain in right hand: Secondary | ICD-10-CM | POA: Diagnosis not present

## 2017-01-19 DIAGNOSIS — Z79899 Other long term (current) drug therapy: Secondary | ICD-10-CM

## 2017-01-19 DIAGNOSIS — M0609 Rheumatoid arthritis without rheumatoid factor, multiple sites: Secondary | ICD-10-CM | POA: Diagnosis not present

## 2017-01-19 DIAGNOSIS — M79642 Pain in left hand: Secondary | ICD-10-CM | POA: Diagnosis not present

## 2017-01-19 DIAGNOSIS — E782 Mixed hyperlipidemia: Secondary | ICD-10-CM

## 2017-01-19 DIAGNOSIS — M7062 Trochanteric bursitis, left hip: Secondary | ICD-10-CM | POA: Diagnosis not present

## 2017-01-19 DIAGNOSIS — Z8619 Personal history of other infectious and parasitic diseases: Secondary | ICD-10-CM

## 2017-01-19 DIAGNOSIS — F5101 Primary insomnia: Secondary | ICD-10-CM | POA: Diagnosis not present

## 2017-01-19 DIAGNOSIS — Z8679 Personal history of other diseases of the circulatory system: Secondary | ICD-10-CM

## 2017-01-19 DIAGNOSIS — M7061 Trochanteric bursitis, right hip: Secondary | ICD-10-CM

## 2017-01-19 LAB — CBC WITH DIFFERENTIAL/PLATELET
BASOS ABS: 0 {cells}/uL (ref 0–200)
Basophils Relative: 0 %
Eosinophils Absolute: 98 cells/uL (ref 15–500)
Eosinophils Relative: 2 %
HCT: 36.1 % (ref 35.0–45.0)
HEMOGLOBIN: 11.7 g/dL (ref 11.7–15.5)
LYMPHS PCT: 31 %
Lymphs Abs: 1519 cells/uL (ref 850–3900)
MCH: 26.8 pg — AB (ref 27.0–33.0)
MCHC: 32.4 g/dL (ref 32.0–36.0)
MCV: 82.8 fL (ref 80.0–100.0)
MPV: 10.1 fL (ref 7.5–12.5)
Monocytes Absolute: 392 cells/uL (ref 200–950)
Monocytes Relative: 8 %
NEUTROS PCT: 59 %
Neutro Abs: 2891 cells/uL (ref 1500–7800)
Platelets: 231 10*3/uL (ref 140–400)
RBC: 4.36 MIL/uL (ref 3.80–5.10)
RDW: 14.6 % (ref 11.0–15.0)
WBC: 4.9 10*3/uL (ref 3.8–10.8)

## 2017-01-19 MED ORDER — HYDROXYCHLOROQUINE SULFATE 200 MG PO TABS: 200.0000 mg | ORAL_TABLET | Freq: Two times a day (BID) | ORAL | 1 refills | Status: DC

## 2017-01-20 LAB — COMPLETE METABOLIC PANEL WITH GFR
ALBUMIN: 4.2 g/dL (ref 3.6–5.1)
ALT: 22 U/L (ref 6–29)
AST: 15 U/L (ref 10–35)
Alkaline Phosphatase: 49 U/L (ref 33–130)
BUN: 10 mg/dL (ref 7–25)
CALCIUM: 9.3 mg/dL (ref 8.6–10.4)
CHLORIDE: 107 mmol/L (ref 98–110)
CO2: 27 mmol/L (ref 20–31)
Creat: 0.73 mg/dL (ref 0.50–1.05)
GFR, Est African American: >89 mL/min (ref >=60)
GLUCOSE: 87 mg/dL (ref 65–99)
POTASSIUM: 4.1 mmol/L (ref 3.5–5.3)
SODIUM: 143 mmol/L (ref 135–146)
Total Bilirubin: 0.5 mg/dL (ref 0.2–1.2)
Total Protein: 7 g/dL (ref 6.1–8.1)

## 2017-01-21 DIAGNOSIS — I10 Essential (primary) hypertension: Secondary | ICD-10-CM | POA: Diagnosis not present

## 2017-01-21 DIAGNOSIS — E785 Hyperlipidemia, unspecified: Secondary | ICD-10-CM | POA: Diagnosis not present

## 2017-01-21 DIAGNOSIS — J309 Allergic rhinitis, unspecified: Secondary | ICD-10-CM | POA: Diagnosis not present

## 2017-01-27 DIAGNOSIS — H2513 Age-related nuclear cataract, bilateral: Secondary | ICD-10-CM | POA: Diagnosis not present

## 2017-01-27 DIAGNOSIS — M0570 Rheumatoid arthritis with rheumatoid factor of unspecified site without organ or systems involvement: Secondary | ICD-10-CM | POA: Diagnosis not present

## 2017-01-27 DIAGNOSIS — H04123 Dry eye syndrome of bilateral lacrimal glands: Secondary | ICD-10-CM | POA: Diagnosis not present

## 2017-01-27 DIAGNOSIS — Z79899 Other long term (current) drug therapy: Secondary | ICD-10-CM | POA: Diagnosis not present

## 2017-02-01 ENCOUNTER — Other Ambulatory Visit: Payer: Self-pay | Admitting: Obstetrics and Gynecology

## 2017-02-01 DIAGNOSIS — Z1231 Encounter for screening mammogram for malignant neoplasm of breast: Secondary | ICD-10-CM

## 2017-02-17 ENCOUNTER — Telehealth: Payer: Self-pay | Admitting: Rheumatology

## 2017-02-17 MED ORDER — HYDROXYCHLOROQUINE SULFATE 200 MG PO TABS: 200.0000 mg | ORAL_TABLET | Freq: Two times a day (BID) | ORAL | 0 refills | Status: DC

## 2017-02-17 MED ORDER — HYDROXYCHLOROQUINE SULFATE 200 MG PO TABS: 200.0000 mg | ORAL_TABLET | Freq: Two times a day (BID) | ORAL | 1 refills | Status: DC

## 2017-02-17 NOTE — Telephone Encounter (Signed)
Patient needs Rx called into [facility] for Plaquenill 90 day supply. In the mean time patient will  pick up 14 day supply with [facility], per [facility] directions. [facility] [phone]

## 2017-02-17 NOTE — Telephone Encounter (Signed)
Prescription sent to local pharmacy instead of mail order pharmacy. Resent prescription to mail order pharmacy and sent a 2 week supply to local pharmacy.

## 2017-03-18 ENCOUNTER — Ambulatory Visit: Payer: BLUE CROSS/BLUE SHIELD

## 2017-03-29 ENCOUNTER — Ambulatory Visit
Admission: RE | Admit: 2017-03-29 | Discharge: 2017-03-29 | Disposition: A | Discharge status: Home / Self Care | Payer: BLUE CROSS/BLUE SHIELD | Source: Ambulatory Visit | Attending: Obstetrics and Gynecology | Admitting: Obstetrics and Gynecology

## 2017-03-29 DIAGNOSIS — Z1231 Encounter for screening mammogram for malignant neoplasm of breast: Secondary | ICD-10-CM | POA: Diagnosis not present

## 2017-06-02 ENCOUNTER — Other Ambulatory Visit: Payer: Self-pay | Admitting: Radiology

## 2017-06-02 ENCOUNTER — Telehealth: Payer: Self-pay | Admitting: Rheumatology

## 2017-06-02 MED ORDER — HYDROXYCHLOROQUINE SULFATE 200 MG PO TABS
200.0000 mg | ORAL_TABLET | Freq: Two times a day (BID) | ORAL | 0 refills | Status: DC
Start: 2017-06-02 — End: 2018-04-28

## 2017-06-02 NOTE — Telephone Encounter (Signed)
Called patient to verify what dose she is using one bid Monday through Friday / this would be correct dose with her height, it was in computer incorrectly, I have resent for correct dose.

## 2017-06-02 NOTE — Telephone Encounter (Signed)
01/20/17 last visit  06/22/17 next visit     CBC Latest Ref Rng & Units 01/19/2017 12/10/2011 12/01/2011  WBC 3.8 - 10.8 K/uL 4.9 10.2 4.5  Hemoglobin 11.7 - 15.5 g/dL 11.7 9.6(L) 12.0  Hematocrit 35.0 - 45.0 % 36.1 29.2(L) 36.6  Platelets 140 - 400 K/uL 231 167 221    CMP Latest Ref Rng & Units 01/19/2017 12/01/2011  Glucose 65 - 99 mg/dL 87 91  BUN 7 - 25 mg/dL 10 7  Creatinine 0.50 - 1.05 mg/dL 0.73 0.59  Sodium 135 - 146 mmol/L 143 141  Potassium 3.5 - 5.3 mmol/L 4.1 3.9  Chloride 98 - 110 mmol/L 107 106  CO2 20 - 31 mmol/L 27 29  Calcium 8.6 - 10.4 mg/dL 9.3 9.5  Total Protein 6.1 - 8.1 g/dL 7.0 -  Total Bilirubin 0.2 - 1.2 mg/dL 0.5 -  Alkaline Phos 33 - 130 U/L 49 -  AST 10 - 35 U/L 15 -  ALT 6 - 29 U/L 22 -   eye exam 01/27/17  Ok to refill per Dr Estanislado Pandy

## 2017-06-02 NOTE — Telephone Encounter (Signed)
Patient needs a refill on Plaquenil. Patient uses [facility] on [address] in [place].

## 2017-06-09 NOTE — Progress Notes (Signed)
Office Visit Note  Patient: Stacey Turner             Date of Birth: 1961-08-14           MRN: [id number]             PCP: [name], MD Referring: [name], MD Visit Date: 06/22/2017 Occupation: @GUAROCC @    Subjective:  Occasional discomfort and hip joints and elbow joints.   History of Present Illness: Stacey Turner is a 56 y.o. female with history of seronegative rheumatoid arthritis. She states she has off-and-on discomfort in her bilateral trochanteric area and sometimes in her elbow. She denies any joint swelling.  Activities of Daily Living:  Patient reports morning stiffness for 0 minutes.   Patient Denies nocturnal pain.  Difficulty dressing/grooming: Denies Difficulty climbing stairs: Denies Difficulty getting out of chair: Denies Difficulty using hands for taps, buttons, cutlery, and/or writing: Denies   Review of Systems  Constitutional: Positive for fatigue. Negative for night sweats, weight gain, weight loss and weakness.  HENT: Negative.  Negative for mouth sores, trouble swallowing, trouble swallowing, mouth dryness and nose dryness.   Eyes: Negative.  Negative for pain, redness, visual disturbance and dryness.  Respiratory: Negative.  Negative for cough, shortness of breath and difficulty breathing.   Cardiovascular: Negative.  Negative for chest pain, palpitations, hypertension, irregular heartbeat and swelling in legs/feet.  Gastrointestinal: Negative.  Negative for blood in stool, constipation and diarrhea.  Endocrine: Negative.  Negative for increased urination.  Genitourinary: Negative.  Negative for vaginal dryness.  Musculoskeletal: Positive for arthralgias and joint pain. Negative for joint swelling, myalgias, muscle weakness, morning stiffness, muscle tenderness and myalgias.       After sitting for a while  Skin: Negative.  Negative for color change, rash, hair loss, skin tightness, ulcers and sensitivity to sunlight.    Allergic/Immunologic: Negative.  Negative for susceptible to infections.  Neurological: Negative.  Negative for dizziness, memory loss and night sweats.  Hematological: Negative for swollen glands.       States gland swollen on left side of neck   Psychiatric/Behavioral: Positive for depressed mood and sleep disturbance. The patient is not nervous/anxious.     PMFS History:  Patient Active Problem List   Diagnosis Date Noted  . Primary insomnia 01/16/2017  . Rheumatoid arthritis of multiple sites with negative rheumatoid factor ([place]) 01/05/2017  . High risk medication use 01/05/2017  . Trochanteric bursitis of both hips 01/05/2017  . Palpitations 10/06/2013  . Cardiac murmur 10/06/2013  . Essential hypertension 10/06/2013  . Hyperlipidemia 10/06/2013  . S/P myomectomy 03/14/2012  . Obesity 01/19/2012  . Fibroids 01/19/2012  . Pelvic pain 01/19/2012    Past Medical History:  Diagnosis Date  . Cardiac murmur   . Fibroid uterus 2001  . H/O abdominal pain 04/24/2011  . H/O dysmenorrhea 2001  . H/O hemorrhoids 2006  . H/O: menorrhagia 2001  . Headache(784.0)   . High cholesterol 2003  . 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 Hypertension   . Irregular periods/menstrual cycles 1999  . Monilial vaginitis 2003  . Palpitations   . Pelvic pain in female 11/25/2011  . PONV (postoperative nausea and vomiting)   . Sinusitis 2009  . Stress 2004  . Yeast vaginitis 2004    Family History  Problem Relation Age of Onset  . Heart failure Mother   . Cancer Father        Colon  . Breast cancer Neg Hx    Past  Surgical History:  Procedure Laterality Date  . BREAST BIOPSY Left 2016  . BUNIONECTOMY    . GANGLION CYST EXCISION    . MYOMECTOMY  12/09/2011   Procedure: MYOMECTOMY;  Surgeon: Ena Dawley, MD;  Location: [facility];  Service: Gynecology;  Laterality: N/A;  Abdominal Myomectomy, Biopsy abdominal Myometrium   Social History   Social History Narrative  . No narrative on file      Objective: Vital Signs: BP 122/64   Pulse 82   Resp 16   Ht 5\' 2"  (1.575 m)   Wt 200 lb (90.7 kg)   LMP 12/27/2013 Comment: perimenopausa;  BMI 36.58 kg/m    Physical Exam  Constitutional: She is oriented to person, place, and time. She appears well-developed and well-nourished.  HENT:  Head: Normocephalic and atraumatic.  Eyes: Conjunctivae and EOM are normal.  Neck: Normal range of motion.  Cardiovascular: Normal rate, regular rhythm, normal heart sounds and intact distal pulses.   Pulmonary/Chest: Effort normal and breath sounds normal.  Abdominal: Soft. Bowel sounds are normal.  Lymphadenopathy:    She has no cervical adenopathy.  Neurological: She is alert and oriented to person, place, and time.  Skin: Skin is warm and dry. Capillary refill takes less than 2 seconds.  Psychiatric: She has a normal mood and affect. Her behavior is normal.  Nursing note and vitals reviewed.    Musculoskeletal Exam: C-spine and thoracic lumbar spine good range of motion. Shoulder joints elbow joints wrist joints are good range of motion. She has no synovitis over MCPs. She is some DIP thickening in her hands. Hip joints knee joints ankles MTPs PIPs with good range of motion. She is tenderness over bilateral trochanteric bursa consistent with trochanteric bursitis.  CDAI Exam: CDAI Homunculus Exam:   Joint Counts:  CDAI Tender Joint count: 0 CDAI Swollen Joint count: 0  Global Assessments:  Patient Global Assessment: 4 Provider Global Assessment: 2  CDAI Calculated Score: 6    Investigation: No additional findings.PLQ eye exam: 01/2017 CBC Latest Ref Rng & Units 01/19/2017 12/10/2011 12/01/2011  WBC 3.8 - 10.8 K/uL 4.9 10.2 4.5  Hemoglobin 11.7 - 15.5 g/dL 11.7 9.6(L) 12.0  Hematocrit 35.0 - 45.0 % 36.1 29.2(L) 36.6  Platelets 140 - 400 K/uL 231 167 221   CMP Latest Ref Rng & Units 01/19/2017 12/01/2011  Glucose 65 - 99 mg/dL 87 91  BUN 7 - 25 mg/dL 10 7  Creatinine 0.50 -  1.05 mg/dL 0.73 0.59  Sodium 135 - 146 mmol/L 143 141  Potassium 3.5 - 5.3 mmol/L 4.1 3.9  Chloride 98 - 110 mmol/L 107 106  CO2 20 - 31 mmol/L 27 29  Calcium 8.6 - 10.4 mg/dL 9.3 9.5  Total Protein 6.1 - 8.1 g/dL 7.0 -  Total Bilirubin 0.2 - 1.2 mg/dL 0.5 -  Alkaline Phos 33 - 130 U/L 49 -  AST 10 - 35 U/L 15 -  ALT 6 - 29 U/L 22 -    Imaging: No results found.  Speciality Comments: No specialty comments available.    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 She states she has intermittent swelling.  High risk medication use - PLQ 200 mg po bid M-F eye exam: 01/2017 - Plan: CBC with Differential/Platelet, COMPLETE METABOLIC PANEL WITH GFR WE will obtain today and then every 5  months. Her next eye exam will be in May 2019.  Trochanteric bursitis of both hips: ITB and exercise were demonstrated and discussed.  Other fatigue - Plan: VITAMIN D 25 Hydroxy (Vit-D Deficiency, Fractures)  History of hypertension: Her blood pressure is controlled.  Primary insomnia: Good sleep hygiene discussed.  Mixed hyperlipidemia - She is following up with PCP   Obesity (BMI 35.0-39.9 without comorbidity) : Weight loss diet and exercise was discussed.  Association of heart disease with rheumatoid arthritis was discussed. Need to monitor blood pressure, cholesterol, and to exercise 30-60 minutes on daily basis was discussed. Poor dental hygiene can be a predisposing factor for rheumatoid arthritis. Good dental hygiene was discussed.   Orders: Orders Placed This Encounter  Procedures  . CBC with Differential/Platelet  . COMPLETE METABOLIC PANEL WITH GFR  . VITAMIN D 25 Hydroxy (Vit-D Deficiency, Fractures)   No orders of the defined types were placed in this encounter.   Face-to-face time spent with  patient was 30 minutes. Greater than 50% of time was spent in counseling and coordination of care.  Follow-Up Instructions: Return in about 5 months (around 11/20/2017) for Rheumatoid arthritis.   Bo Merino, MD  Note - This record has been created using Editor, commissioning.  Chart creation errors have been sought, but may not always  have been located. Such creation errors do not reflect on  the standard of medical care.

## 2017-06-22 ENCOUNTER — Ambulatory Visit (INDEPENDENT_AMBULATORY_CARE_PROVIDER_SITE_OTHER): Payer: BLUE CROSS/BLUE SHIELD | Admitting: Rheumatology

## 2017-06-22 ENCOUNTER — Encounter: Payer: Self-pay | Admitting: Rheumatology

## 2017-06-22 VITALS — BP 122/64 | HR 82 | Resp 16 | Ht 62.0 in | Wt 200.0 lb

## 2017-06-22 DIAGNOSIS — E782 Mixed hyperlipidemia: Secondary | ICD-10-CM | POA: Diagnosis not present

## 2017-06-22 DIAGNOSIS — M7062 Trochanteric bursitis, left hip: Secondary | ICD-10-CM | POA: Diagnosis not present

## 2017-06-22 DIAGNOSIS — R5383 Other fatigue: Secondary | ICD-10-CM | POA: Diagnosis not present

## 2017-06-22 DIAGNOSIS — Z8679 Personal history of other diseases of the circulatory system: Secondary | ICD-10-CM

## 2017-06-22 DIAGNOSIS — M7061 Trochanteric bursitis, right hip: Secondary | ICD-10-CM | POA: Diagnosis not present

## 2017-06-22 DIAGNOSIS — F5101 Primary insomnia: Secondary | ICD-10-CM | POA: Diagnosis not present

## 2017-06-22 DIAGNOSIS — M0609 Rheumatoid arthritis without rheumatoid factor, multiple sites: Secondary | ICD-10-CM

## 2017-06-22 DIAGNOSIS — E669 Obesity, unspecified: Secondary | ICD-10-CM | POA: Diagnosis not present

## 2017-06-22 DIAGNOSIS — Z79899 Other long term (current) drug therapy: Secondary | ICD-10-CM | POA: Diagnosis not present

## 2017-06-22 NOTE — Patient Instructions (Addendum)
Standing Labs We placed an order today for your standing lab work.    Please come back and get your standing labs in March  We have open lab Monday through Friday from 8:30-11:30 AM and 1:30-4 PM at the office of Dr. Bo Merino.   The office is located at [address] [place], [place], [place] [address] No appointment is necessary.   Labs are drawn by [facility].  You may receive a bill from [facility] for your lab work. If you have any questions regarding directions or hours of operation,  please call [phone].     Association of heart disease with rheumatoid arthritis was discussed. Need to monitor blood pressure, cholesterol, and to exercise 30-60 minutes on daily basis was discussed. Poor dental hygiene can be a predisposing factor for rheumatoid arthritis. Good dental hygiene was discussed.

## 2017-06-23 LAB — CBC WITH DIFFERENTIAL/PLATELET
Basophils Absolute: 62 cells/uL (ref 0–200)
Basophils Relative: 1.3 %
Eosinophils Absolute: 240 cells/uL (ref 15–500)
Eosinophils Relative: 5 %
HEMATOCRIT: 33.6 % — AB (ref 35.0–45.0)
Hemoglobin: 11.2 g/dL — ABNORMAL LOW (ref 11.7–15.5)
LYMPHS ABS: 1474 {cells}/uL (ref 850–3900)
MCH: 26.6 pg — ABNORMAL LOW (ref 27.0–33.0)
MCHC: 33.3 g/dL (ref 32.0–36.0)
MCV: 79.8 fL — AB (ref 80.0–100.0)
MPV: 11.8 fL (ref 7.5–12.5)
Monocytes Relative: 9.2 %
NEUTROS ABS: 2582 {cells}/uL (ref 1500–7800)
Neutrophils Relative %: 53.8 %
PLATELETS: 234 10*3/uL (ref 140–400)
RBC: 4.21 10*6/uL (ref 3.80–5.10)
RDW: 13.2 % (ref 11.0–15.0)
TOTAL LYMPHOCYTE: 30.7 %
WBC: 4.8 10*3/uL (ref 3.8–10.8)
WBCMIX: 442 {cells}/uL (ref 200–950)

## 2017-06-23 LAB — COMPLETE METABOLIC PANEL WITH GFR
AG RATIO: 1.5 (calc) (ref 1.0–2.5)
ALT: 16 U/L (ref 6–29)
AST: 16 U/L (ref 10–35)
Albumin: 3.9 g/dL (ref 3.6–5.1)
Alkaline phosphatase (APISO): 56 U/L (ref 33–130)
BUN: 14 mg/dL (ref 7–25)
CALCIUM: 9.1 mg/dL (ref 8.6–10.4)
CO2: 28 mmol/L (ref 20–32)
Chloride: 110 mmol/L (ref 98–110)
Creat: 0.69 mg/dL (ref 0.50–1.05)
GFR, EST AFRICAN AMERICAN: 114 mL/min/{1.73_m2} (ref >=60)
GFR, EST NON AFRICAN AMERICAN: 99 mL/min/{1.73_m2} (ref >=60)
Globulin: 2.6 g/dL (calc) (ref 1.9–3.7)
Glucose, Bld: 96 mg/dL (ref 65–99)
POTASSIUM: 3.8 mmol/L (ref 3.5–5.3)
Sodium: 145 mmol/L (ref 135–146)
TOTAL PROTEIN: 6.5 g/dL (ref 6.1–8.1)
Total Bilirubin: 0.3 mg/dL (ref 0.2–1.2)

## 2017-06-23 LAB — VITAMIN D 25 HYDROXY (VIT D DEFICIENCY, FRACTURES): Vit D, 25-Hydroxy: 11 ng/mL — ABNORMAL LOW (ref 30–100)

## 2017-06-23 NOTE — Progress Notes (Signed)
Vitamin D 50,000 units twice a week for 3 months. Repeat vitamin D level in 3 months

## 2017-06-30 ENCOUNTER — Telehealth: Payer: Self-pay | Admitting: Radiology

## 2017-06-30 DIAGNOSIS — E559 Vitamin D deficiency, unspecified: Secondary | ICD-10-CM

## 2017-06-30 MED ORDER — VITAMIN D3 1.25 MG (50000 UT) PO CAPS: 50000.0000 [IU] | ORAL_CAPSULE | ORAL | 0 refills | Status: DC

## 2017-06-30 NOTE — Telephone Encounter (Signed)
-----   Message from Bo Merino, MD sent at 06/23/2017 12:01 PM EDT ----- Vitamin D 50,000 units twice a week for 3 months. Repeat vitamin D level in 3 months

## 2017-06-30 NOTE — Telephone Encounter (Signed)
Left message for her to call back sent in the Vitamin D and put in a lab order for 3 months

## 2017-07-05 ENCOUNTER — Telehealth: Payer: Self-pay

## 2017-07-05 NOTE — Telephone Encounter (Signed)
Left message on machine for patient to call the office.

## 2017-07-05 NOTE — Telephone Encounter (Signed)
Patient was calling about lab results.  CB# is [phone].  Pleae advise.  Thank you.

## 2017-07-06 ENCOUNTER — Telehealth: Payer: Self-pay | Admitting: Rheumatology

## 2017-07-06 NOTE — Telephone Encounter (Signed)
Left message on machine for patient to call the office.

## 2017-07-06 NOTE — Telephone Encounter (Signed)
Patient would like to clarify Vitamin D dosage. Patient states she only received 4 tablets and instructions on the prescription state take one per week. The lab note states "Vitamin D 50,000 units twice a week for 3 months." Should it be twice a week?

## 2017-07-06 NOTE — Telephone Encounter (Signed)
Her vitamin D is 11. Her prescription should be vitamin D 50,000 units twice a week for 90 days and then repeat levels in 3 months.

## 2017-07-06 NOTE — Telephone Encounter (Signed)
See other telephone encounter.

## 2017-07-06 NOTE — Telephone Encounter (Signed)
Patient called to discuss why she is taking Folic Acid. Patient returning Andrea"s call.

## 2017-07-07 ENCOUNTER — Telehealth: Payer: Self-pay

## 2017-07-07 MED ORDER — VITAMIN D3 1.25 MG (50000 UT) PO CAPS: 50000.0000 [IU] | ORAL_CAPSULE | ORAL | 0 refills | Status: AC

## 2017-07-07 NOTE — Telephone Encounter (Signed)
Prescription resent to the pharmacy for correct dosage. Left message to advise patient.

## 2017-07-07 NOTE — Telephone Encounter (Signed)
Patient was returning Stacey Turner's call. 

## 2017-07-07 NOTE — Addendum Note (Signed)
Addended by: Carole Binning on: 07/07/2017 08:33 AM   Modules accepted: Orders

## 2017-07-26 DIAGNOSIS — E785 Hyperlipidemia, unspecified: Secondary | ICD-10-CM | POA: Diagnosis not present

## 2017-07-26 DIAGNOSIS — Z23 Encounter for immunization: Secondary | ICD-10-CM | POA: Diagnosis not present

## 2017-07-26 DIAGNOSIS — I10 Essential (primary) hypertension: Secondary | ICD-10-CM | POA: Diagnosis not present

## 2017-07-26 DIAGNOSIS — J3089 Other allergic rhinitis: Secondary | ICD-10-CM | POA: Diagnosis not present

## 2017-08-24 DIAGNOSIS — Z1211 Encounter for screening for malignant neoplasm of colon: Secondary | ICD-10-CM | POA: Diagnosis not present

## 2017-08-24 DIAGNOSIS — D509 Iron deficiency anemia, unspecified: Secondary | ICD-10-CM | POA: Diagnosis not present

## 2017-08-24 DIAGNOSIS — K219 Gastro-esophageal reflux disease without esophagitis: Secondary | ICD-10-CM | POA: Diagnosis not present

## 2017-09-26 ENCOUNTER — Other Ambulatory Visit: Payer: Self-pay | Admitting: Rheumatology

## 2017-09-27 NOTE — Telephone Encounter (Signed)
Left message to advise patient will need labs rechecked before refilling.

## 2017-10-27 ENCOUNTER — Ambulatory Visit: Payer: BLUE CROSS/BLUE SHIELD | Admitting: Podiatry

## 2017-10-27 DIAGNOSIS — M79671 Pain in right foot: Secondary | ICD-10-CM

## 2017-10-27 DIAGNOSIS — M21619 Bunion of unspecified foot: Secondary | ICD-10-CM

## 2017-10-27 DIAGNOSIS — M2041 Other hammer toe(s) (acquired), right foot: Secondary | ICD-10-CM | POA: Diagnosis not present

## 2017-10-27 DIAGNOSIS — M21622 Bunionette of left foot: Secondary | ICD-10-CM

## 2017-10-27 DIAGNOSIS — M79672 Pain in left foot: Secondary | ICD-10-CM

## 2017-10-27 NOTE — Progress Notes (Signed)
  SUBJECTIVE: 57 y.o. year old female presents complaining of painful bunion on right foot. Right foot pain getting worse with frequent swelling. Had a jar dropped on top of the foot near the bunion area. Now having difficult time wearing regular shoe.  Positive history of left foot bunion surgery about 25 years ago.  Review of Systems  Constitutional: Negative.   HENT: Negative.   Eyes: Negative.   Respiratory: Negative.   Gastrointestinal: Negative.   Genitourinary: Negative.   Musculoskeletal:       Just been diagnosed with Rheumatoid arthritis.  Skin: Negative.      OBJECTIVE: DERMATOLOGIC EXAMINATION: Normal findings.  VASCULAR EXAMINATION OF LOWER LIMBS: All pedal pulses are palpable with normal pulsation.  Capillary Filling times within 3 seconds in all digits.  No edema or erythema noted. Temperature gradient from tibial crest to dorsum of foot is within normal bilateral.  NEUROLOGIC EXAMINATION OF THE LOWER LIMBS: Achilles DTR is present and within normal. Monofilament (Semmes-Weinstein 10-gm) sensory testing positive 6 out of 6, bilateral. Vibratory sensations(128Hz  turning fork) intact at medial and lateral forefoot bilateral.  Sharp and Dull discriminatory sensations at the plantar ball of hallux is intact bilateral.   MUSCULOSKELETAL EXAMINATION: Positive for enlarged metatarsal head with dorsal protrusion right. Contracted digits with digital corn 4th bilateral.  RADIOGRAPHIC STUDIES:  AP View:  Short first metatarsal bone bilateral. Internal fixation screw present left metatarsal head post bunion surgery. Enlarged medial eminence right first metatarsal head and 5th metatarsal head left. Lateral view:  Dorsal spur first metatarsal head right foot. Post surgical internal fixation screw in place left foot. Contracted DIPJ 4th right.   ASSESSMENT: Painful bunion on right. Pain over 5th metatarsal head with skin lesion left. Contracted 4th toe with skin  lesion right.  PLAN: Reviewed findings and available treatment options. As per request, surgery consent form reviewed for McBride bunionectomy right, Taylor's bunionectomy left, Flexor tenotomy 4th right.

## 2017-10-27 NOTE — Patient Instructions (Signed)
Seen for bilateral foot pain.  Reviewed available treatment options. Surgery consent form reviewed for McBride bunionectomy right, Tailor's bunionectomy left, Flexor tendon release 4th right.

## 2017-10-28 ENCOUNTER — Encounter: Payer: Self-pay | Admitting: Podiatry

## 2017-10-29 ENCOUNTER — Telehealth: Payer: Self-pay | Admitting: *Deleted

## 2017-10-29 NOTE — Telephone Encounter (Signed)
Surgical Benefits as follows: for CPT codes [phone], [phone] there is no authorization needed. The plan pays 85%. The patient has a $400 deductible and $0 of deductible has been met.We require $200 down payment for surgery.  Chelsey was the representative and the ref # for the call is [id number].  Called and left vm for patient to call back.

## 2017-11-05 NOTE — Telephone Encounter (Signed)
Patient called and would like to be scheduled for March 8th for surgery

## 2017-11-08 NOTE — Telephone Encounter (Signed)
Patient notified about surgery benefit. [place] and added patient on for MArch 8th.

## 2017-11-08 NOTE — Telephone Encounter (Signed)
Called and left message for patient to call back.

## 2017-11-10 DIAGNOSIS — Z1211 Encounter for screening for malignant neoplasm of colon: Secondary | ICD-10-CM | POA: Diagnosis not present

## 2017-11-10 DIAGNOSIS — Z800 Family history of malignant neoplasm of digestive organs: Secondary | ICD-10-CM | POA: Diagnosis not present

## 2017-11-10 DIAGNOSIS — K5730 Diverticulosis of large intestine without perforation or abscess without bleeding: Secondary | ICD-10-CM | POA: Diagnosis not present

## 2017-11-10 NOTE — Progress Notes (Signed)
Office Visit Note  Patient: Stacey Turner             Date of Birth: 1961-08-18           MRN: [id number]             PCP: [name], MD Referring: [name], MD Visit Date: 11/24/2017 Occupation: @GUAROCC @    Subjective:  Joint stiffness.   History of Present Illness: Stacey Turner is a 57 y.o. female history of seronegative rheumatoid arthritis.  She states she has been doing quite well on Plaquenil.  She has  stiffness after prolonged sitting.  She denies any joint swelling or discomfort.  Activities of Daily Living:  Patient reports morning stiffness for 0 minutes.   Patient Denies nocturnal pain.  Difficulty dressing/grooming: Denies Difficulty climbing stairs: Denies Difficulty getting out of chair: Denies Difficulty using hands for taps, buttons, cutlery, and/or writing: Denies   Review of Systems  Constitutional: Negative for fatigue, night sweats, weight gain, weight loss and weakness.  HENT: Negative for mouth sores, trouble swallowing, trouble swallowing, mouth dryness and nose dryness.   Eyes: Negative for pain, redness, visual disturbance and dryness.  Respiratory: Negative for cough, shortness of breath and difficulty breathing.   Cardiovascular: Positive for hypertension. Negative for chest pain, palpitations, irregular heartbeat and swelling in legs/feet.  Gastrointestinal: Negative for blood in stool, constipation and diarrhea.  Endocrine: Negative for increased urination.  Genitourinary: Negative for vaginal dryness.  Musculoskeletal: Negative for arthralgias, joint pain, joint swelling, myalgias, muscle weakness, morning stiffness, muscle tenderness and myalgias.  Skin: Negative for color change, rash, hair loss, skin tightness, ulcers and sensitivity to sunlight.  Allergic/Immunologic: Negative for susceptible to infections.  Neurological: Negative for dizziness, memory loss and night sweats.  Hematological: Negative for swollen glands.    Psychiatric/Behavioral: Negative for depressed mood and sleep disturbance. The patient is not nervous/anxious.     PMFS History:  Patient Active Problem List   Diagnosis Date Noted  . Primary insomnia 01/16/2017  . Rheumatoid arthritis of multiple sites with negative rheumatoid factor ([place]) 01/05/2017  . High risk medication use 01/05/2017  . Trochanteric bursitis of both hips 01/05/2017  . Palpitations 10/06/2013  . Cardiac murmur 10/06/2013  . Essential hypertension 10/06/2013  . Hyperlipidemia 10/06/2013  . S/P myomectomy 03/14/2012  . Obesity 01/19/2012  . Fibroids 01/19/2012  . Pelvic pain 01/19/2012    Past Medical History:  Diagnosis Date  . Cardiac murmur   . Fibroid uterus 2001  . H/O abdominal pain 04/24/2011  . H/O dysmenorrhea 2001  . H/O hemorrhoids 2006  . H/O: menorrhagia 2001  . Headache(784.0)   . High cholesterol 2003  . 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 Hypertension   . Irregular periods/menstrual cycles 1999  . Monilial vaginitis 2003  . Palpitations   . Pelvic pain in female 11/25/2011  . PONV (postoperative nausea and vomiting)   . Sinusitis 2009  . Stress 2004  . Yeast vaginitis 2004    Family History  Problem Relation Age of Onset  . Heart failure Mother   . Cancer Father        Colon  . Breast cancer Neg Hx    Past Surgical History:  Procedure Laterality Date  . BREAST BIOPSY Left 2016  . BUNIONECTOMY    . GANGLION CYST EXCISION    . MYOMECTOMY  12/09/2011   Procedure: MYOMECTOMY;  Surgeon: Ena Dawley, MD;  Location: [facility];  Service: Gynecology;  Laterality: N/A;  Abdominal  Myomectomy, Biopsy abdominal Myometrium   Social History   Social History Narrative  . Not on file     Objective: Vital Signs: BP 136/76 (BP Location: Left Arm, Patient Position: Sitting, Cuff Size: Normal)   Pulse 83   Resp 16   Ht 5\' 2"  (1.575 m)   Wt 197 lb (89.4 kg)   LMP 12/27/2013 Comment: perimenopausa;  BMI 36.03 kg/m    Physical Exam   Constitutional: She is oriented to person, place, and time. She appears well-developed and well-nourished.  HENT:  Head: Normocephalic and atraumatic.  Eyes: Conjunctivae and EOM are normal.  Neck: Normal range of motion.  Cardiovascular: Normal rate, regular rhythm, normal heart sounds and intact distal pulses.  Pulmonary/Chest: Effort normal and breath sounds normal.  Abdominal: Soft. Bowel sounds are normal.  Lymphadenopathy:    She has no cervical adenopathy.  Neurological: She is alert and oriented to person, place, and time.  Skin: Skin is warm and dry. Capillary refill takes less than 2 seconds.  Psychiatric: She has a normal mood and affect. Her behavior is normal.  Nursing note and vitals reviewed.    Musculoskeletal Exam: C-spine thoracic lumbar spine good range of motion.  Shoulder joints elbow joints wrist joint MCPs PIPs DIPs with good range of motion with no synovitis.  Joints knee joints ankles MTPs PIPs with good range of motion with no synovitis.  CDAI Exam: CDAI Homunculus Exam:   Joint Counts:  CDAI Tender Joint count: 0 CDAI Swollen Joint count: 0  Global Assessments:  Patient Global Assessment: 3 Provider Global Assessment: 3  CDAI Calculated Score: 6    Investigation: No additional findings. PLQ eye exam: 01/2017 CBC Latest Ref Rng & Units 11/17/2017 06/22/2017 01/19/2017  WBC 3.8 - 10.8 Thousand/uL 4.2 4.8 4.9  Hemoglobin 11.7 - 15.5 g/dL 11.6(L) 11.2(L) 11.7  Hematocrit 35.0 - 45.0 % 35.8 33.6(L) 36.1  Platelets 140 - 400 Thousand/uL 243 234 231   CMP Latest Ref Rng & Units 11/17/2017 06/22/2017 01/19/2017  Glucose 65 - 99 mg/dL 94 96 87  BUN 7 - 25 mg/dL 9 14 10   Creatinine 0.50 - 1.05 mg/dL 0.64 0.69 0.73  Sodium 135 - 146 mmol/L 142 145 143  Potassium 3.5 - 5.3 mmol/L 3.7 3.8 4.1  Chloride 98 - 110 mmol/L 105 110 107  CO2 20 - 32 mmol/L 30 28 27   Calcium 8.6 - 10.4 mg/dL 9.6 9.1 9.3  Total Protein 6.1 - 8.1 g/dL 7.2 6.5 7.0  Total Bilirubin 0.2 -  1.2 mg/dL 0.4 0.3 0.5  Alkaline Phos 33 - 130 U/L - - 49  AST 10 - 35 U/L 15 16 15   ALT 6 - 29 U/L 15 16 22     Imaging: No results found.  Speciality Comments: No specialty comments available.    Procedures:  No procedures performed Allergies: Latex and Penicillins   Assessment / Plan:     Visit Diagnoses: Rheumatoid arthritis of multiple sites with negative rheumatoid factor [facility]): Patient had no synovitis on examination.  Her last flare was in November.  Doing much better on Plaquenil now.  High risk medication use - PLQ 200 mg po bid M-F eye exam: 01/2017.  Her eye exam and labs have been normal.  We will continue to monitor labs.  Trochanteric bursitis of both hips: Stretching exercises were discussed.  Primary insomnia: Sleep hygiene was discussed.  Other fatigue: Related to insomnia.  Vitamin D deficiency: Her vitamin D was 11 and after the having treatment  for 3 months her vitamin D was in 90s.  She will be off vitamin D now.  I have advised her to resume vitamin D after a couple of months.  History of obesity: Weight loss diet and exercise was discussed.  History of hypertension: Blood pressure is controlled.  Mixed hyperlipidemia - She is following up with PCP     Orders: No orders of the defined types were placed in this encounter.  No orders of the defined types were placed in this encounter.   Face-to-face time spent with patient was 30 minutes.  Greater than 50% of time was spent in counseling and coordination of care.  Follow-Up Instructions: Return in about 5 months (around 04/26/2018) for Rheumatoid arthritis.   Bo Merino, MD  Note - This record has been created using Editor, commissioning.  Chart creation errors have been sought, but may not always  have been located. Such creation errors do not reflect on  the standard of medical care.

## 2017-11-15 ENCOUNTER — Telehealth: Payer: Self-pay | Admitting: *Deleted

## 2017-11-15 NOTE — Telephone Encounter (Signed)
Patient is trying to fill out FMLA paper work for her upcoming surgery. She would like to know what her approximate recovery time will be. Please advise

## 2017-11-16 NOTE — Telephone Encounter (Signed)
It would be 4-6 weeks. After 4 weeks she can do her light walking, but would be 6 weeks before she gets comfortable wearing her closed in shoes.

## 2017-11-17 ENCOUNTER — Other Ambulatory Visit: Payer: Self-pay

## 2017-11-17 DIAGNOSIS — Z79899 Other long term (current) drug therapy: Secondary | ICD-10-CM

## 2017-11-17 DIAGNOSIS — E559 Vitamin D deficiency, unspecified: Secondary | ICD-10-CM

## 2017-11-18 LAB — CBC WITH DIFFERENTIAL/PLATELET
BASOS PCT: 1.2 %
Basophils Absolute: 50 cells/uL (ref 0–200)
EOS ABS: 139 {cells}/uL (ref 15–500)
Eosinophils Relative: 3.3 %
HEMATOCRIT: 35.8 % (ref 35.0–45.0)
Hemoglobin: 11.6 g/dL — ABNORMAL LOW (ref 11.7–15.5)
LYMPHS ABS: 1474 {cells}/uL (ref 850–3900)
MCH: 25.8 pg — AB (ref 27.0–33.0)
MCHC: 32.4 g/dL (ref 32.0–36.0)
MCV: 79.7 fL — AB (ref 80.0–100.0)
MPV: 12.2 fL (ref 7.5–12.5)
Monocytes Relative: 8.3 %
Neutro Abs: 2188 cells/uL (ref 1500–7800)
Neutrophils Relative %: 52.1 %
PLATELETS: 243 10*3/uL (ref 140–400)
RBC: 4.49 10*6/uL (ref 3.80–5.10)
RDW: 13 % (ref 11.0–15.0)
TOTAL LYMPHOCYTE: 35.1 %
WBC: 4.2 10*3/uL (ref 3.8–10.8)
WBCMIX: 349 {cells}/uL (ref 200–950)

## 2017-11-18 LAB — COMPLETE METABOLIC PANEL WITH GFR
AG RATIO: 1.5 (calc) (ref 1.0–2.5)
ALKALINE PHOSPHATASE (APISO): 55 U/L (ref 33–130)
ALT: 15 U/L (ref 6–29)
AST: 15 U/L (ref 10–35)
Albumin: 4.3 g/dL (ref 3.6–5.1)
BILIRUBIN TOTAL: 0.4 mg/dL (ref 0.2–1.2)
BUN: 9 mg/dL (ref 7–25)
CHLORIDE: 105 mmol/L (ref 98–110)
CO2: 30 mmol/L (ref 20–32)
Calcium: 9.6 mg/dL (ref 8.6–10.4)
Creat: 0.64 mg/dL (ref 0.50–1.05)
GFR, Est African American: 117 mL/min/{1.73_m2} (ref >=60)
GFR, Est Non African American: 101 mL/min/{1.73_m2} (ref >=60)
GLUCOSE: 94 mg/dL (ref 65–99)
Globulin: 2.9 g/dL (calc) (ref 1.9–3.7)
POTASSIUM: 3.7 mmol/L (ref 3.5–5.3)
Sodium: 142 mmol/L (ref 135–146)
Total Protein: 7.2 g/dL (ref 6.1–8.1)

## 2017-11-18 LAB — VITAMIN D 25 HYDROXY (VIT D DEFICIENCY, FRACTURES): VIT D 25 HYDROXY: 97 ng/mL (ref 30–100)

## 2017-11-19 NOTE — Telephone Encounter (Signed)
Patient notified

## 2017-11-19 NOTE — Telephone Encounter (Signed)
Left message for patient to call back  

## 2017-11-24 ENCOUNTER — Encounter: Payer: Self-pay | Admitting: Rheumatology

## 2017-11-24 ENCOUNTER — Ambulatory Visit: Payer: BLUE CROSS/BLUE SHIELD | Admitting: Rheumatology

## 2017-11-24 VITALS — BP 136/76 | HR 83 | Resp 16 | Ht 62.0 in | Wt 197.0 lb

## 2017-11-24 DIAGNOSIS — R5383 Other fatigue: Secondary | ICD-10-CM | POA: Diagnosis not present

## 2017-11-24 DIAGNOSIS — M0609 Rheumatoid arthritis without rheumatoid factor, multiple sites: Secondary | ICD-10-CM | POA: Diagnosis not present

## 2017-11-24 DIAGNOSIS — F5101 Primary insomnia: Secondary | ICD-10-CM

## 2017-11-24 DIAGNOSIS — E559 Vitamin D deficiency, unspecified: Secondary | ICD-10-CM

## 2017-11-24 DIAGNOSIS — Z8679 Personal history of other diseases of the circulatory system: Secondary | ICD-10-CM | POA: Diagnosis not present

## 2017-11-24 DIAGNOSIS — M7061 Trochanteric bursitis, right hip: Secondary | ICD-10-CM

## 2017-11-24 DIAGNOSIS — E782 Mixed hyperlipidemia: Secondary | ICD-10-CM | POA: Diagnosis not present

## 2017-11-24 DIAGNOSIS — Z79899 Other long term (current) drug therapy: Secondary | ICD-10-CM

## 2017-11-24 DIAGNOSIS — M7062 Trochanteric bursitis, left hip: Secondary | ICD-10-CM | POA: Diagnosis not present

## 2017-11-24 DIAGNOSIS — Z8639 Personal history of other endocrine, nutritional and metabolic disease: Secondary | ICD-10-CM

## 2017-11-24 NOTE — Telephone Encounter (Signed)
Pt made a payment on $200.00 towards her surgery today.

## 2017-11-24 NOTE — Patient Instructions (Signed)
Standing Labs We placed an order today for your standing lab work.    Please come back and get your standing labs in July  We have open lab Monday through Friday from 8:30-11:30 AM and 1:30-4 PM at the office of Dr. Bo Merino.   The office is located at [address] [place], [place], [place] [address] No appointment is necessary.   Labs are drawn by [facility].  You may receive a bill from [facility] for your lab work. If you have any questions regarding directions or hours of operation,  please call [phone].

## 2017-11-25 ENCOUNTER — Other Ambulatory Visit: Payer: Self-pay | Admitting: Podiatry

## 2017-11-25 MED ORDER — HYDROCODONE-IBUPROFEN 7.5-200 MG PO TABS: 1.0000 | ORAL_TABLET | Freq: Four times a day (QID) | ORAL | 0 refills | Status: DC | PRN

## 2017-11-26 DIAGNOSIS — M2041 Other hammer toe(s) (acquired), right foot: Secondary | ICD-10-CM | POA: Diagnosis not present

## 2017-11-26 DIAGNOSIS — M2570 Osteophyte, unspecified joint: Secondary | ICD-10-CM | POA: Diagnosis not present

## 2017-11-26 DIAGNOSIS — M2011 Hallux valgus (acquired), right foot: Secondary | ICD-10-CM | POA: Diagnosis not present

## 2017-11-26 DIAGNOSIS — M2010 Hallux valgus (acquired), unspecified foot: Secondary | ICD-10-CM | POA: Diagnosis not present

## 2017-12-01 ENCOUNTER — Ambulatory Visit (INDEPENDENT_AMBULATORY_CARE_PROVIDER_SITE_OTHER): Payer: BLUE CROSS/BLUE SHIELD | Admitting: Podiatry

## 2017-12-01 ENCOUNTER — Encounter: Payer: Self-pay | Admitting: Podiatry

## 2017-12-01 DIAGNOSIS — M21622 Bunionette of left foot: Secondary | ICD-10-CM

## 2017-12-01 DIAGNOSIS — M2041 Other hammer toe(s) (acquired), right foot: Secondary | ICD-10-CM | POA: Diagnosis not present

## 2017-12-01 DIAGNOSIS — M21619 Bunion of unspecified foot: Secondary | ICD-10-CM

## 2017-12-01 NOTE — Patient Instructions (Signed)
Normal post op wound healing without complication. Dressing replaced.  Turner for weight bearing ambulation as tolerated. Return in one week.

## 2017-12-01 NOTE — Progress Notes (Signed)
5 day post op wound, S/P McBride bunionectomy, Flexor tenotomy 4th  right, Tailor's bunionectomy left. Denies any fever or chills. Having itch feeling and suture irritation on right 4th toe. Post op X-ray normal with reduced bunion and hammer toe deformity. Dressing changed and applied Amerigel ointment dressing after cleansing with Iodine. Return in one week for suture removal. Will be out of work for another 2-3 weeks.

## 2017-12-08 ENCOUNTER — Ambulatory Visit (INDEPENDENT_AMBULATORY_CARE_PROVIDER_SITE_OTHER): Payer: BLUE CROSS/BLUE SHIELD | Admitting: Podiatry

## 2017-12-08 ENCOUNTER — Encounter: Payer: Self-pay | Admitting: Podiatry

## 2017-12-08 DIAGNOSIS — M21619 Bunion of unspecified foot: Secondary | ICD-10-CM

## 2017-12-08 NOTE — Progress Notes (Signed)
2 weeks S/P McBride bunionectomy, Flexor tenotomy 4th  right, Tailor's bunionectomy left, 11/26/17. Having skin irritation from bandage. Incision sites well healed. Positive of mild forefoot edema right foot. All sutures removed. Mefix tape placed with Compression stockinet. May use Vitamin A cream for dry skin. Return in 3 weeks to assess readiness of work.

## 2017-12-08 NOTE — Patient Instructions (Signed)
2 weeks post op bilateral foot surgery. Suture removed. Mild forefoot edema noted. Use Compression stockinet during the day. May return to regular shoe as tolerated. Return in 3 weeks.

## 2017-12-21 ENCOUNTER — Ambulatory Visit (INDEPENDENT_AMBULATORY_CARE_PROVIDER_SITE_OTHER): Payer: BLUE CROSS/BLUE SHIELD | Admitting: Podiatry

## 2017-12-21 ENCOUNTER — Encounter: Payer: Self-pay | Admitting: Podiatry

## 2017-12-21 DIAGNOSIS — M21619 Bunion of unspecified foot: Secondary | ICD-10-CM

## 2017-12-21 NOTE — Patient Instructions (Signed)
4 weeks post op wound healing normal. Ok to return to work as of 12/29/17. Return if there is any problem arise.

## 2017-12-21 NOTE — Progress Notes (Signed)
Subjective: 4 weeks S/P McBride bunionectomy, Flexor tenotomy 4th right, Tailor's bunionectomy left, 11/26/17. Stated that she feels she can resume her work by 12/29/17.  Objective: Mild forefoot edema on right foot. Digital corn 4th bilateral. Reduced bunion on right and reduced Tailor's bunion on left foot maintained. Incision sites are all dry, well approximated with dry crusty skin edges.  Assessment: Normal wound healing with corrected deformities without complication, consistent with surgery performed. Debrided digital corn 4th bilateral.  Plan: Continue to use compression stockinet during the day. May return to work on 12/29/17. Return as needed.

## 2017-12-29 ENCOUNTER — Encounter: Payer: BLUE CROSS/BLUE SHIELD | Admitting: Podiatry

## 2018-01-24 DIAGNOSIS — I10 Essential (primary) hypertension: Secondary | ICD-10-CM | POA: Diagnosis not present

## 2018-01-24 DIAGNOSIS — E785 Hyperlipidemia, unspecified: Secondary | ICD-10-CM | POA: Diagnosis not present

## 2018-01-24 DIAGNOSIS — F439 Reaction to severe stress, unspecified: Secondary | ICD-10-CM | POA: Diagnosis not present

## 2018-01-28 DIAGNOSIS — H25813 Combined forms of age-related cataract, bilateral: Secondary | ICD-10-CM | POA: Diagnosis not present

## 2018-01-28 DIAGNOSIS — H11153 Pinguecula, bilateral: Secondary | ICD-10-CM | POA: Diagnosis not present

## 2018-01-28 DIAGNOSIS — M0570 Rheumatoid arthritis with rheumatoid factor of unspecified site without organ or systems involvement: Secondary | ICD-10-CM | POA: Diagnosis not present

## 2018-01-28 DIAGNOSIS — Z79899 Other long term (current) drug therapy: Secondary | ICD-10-CM | POA: Diagnosis not present

## 2018-03-28 ENCOUNTER — Other Ambulatory Visit: Payer: Self-pay | Admitting: Obstetrics and Gynecology

## 2018-03-28 DIAGNOSIS — Z1231 Encounter for screening mammogram for malignant neoplasm of breast: Secondary | ICD-10-CM

## 2018-04-14 NOTE — Progress Notes (Signed)
Office Visit Note  Patient: Stacey Turner             Date of Birth: [birth year]           MRN: [id number]             PCP: [name], MD Referring: [name], MD Visit Date: 04/28/2018 Occupation: @GUAROCC @  Subjective:  Medication monitoring   History of Present Illness: Stacey Turner is a 57 y.o. female with history of seronegative rheumatoid arthritis.  She is on PLQ 200 mg 1 tablet BID M-F.  She denies missing any doses recently. She reports she had an exam with Dr. Jerline Pain in June 2019.  She denies any recent flares.  She denies any joint pain or joint swelling at this time.  She states she had an ultrasound of bilateral hands in May 2018 that was negative for synovitis.  She continues to have bilateral trochanteric bursitis.  She reports she does not notice any benefit when doing exercises.  She denies ever having cortisone injections.  She states that she does not want to go to physical therapy due to the injection not helping.   Activities of Daily Living:  Patient reports morning stiffness for 0 minutes.   Patient Denies nocturnal pain.  Difficulty dressing/grooming: Denies Difficulty climbing stairs: Denies Difficulty getting out of chair: Denies Difficulty using hands for taps, buttons, cutlery, and/or writing: Denies  Review of Systems  Constitutional: Negative for fatigue.  HENT: Negative for mouth sores, mouth dryness and nose dryness.   Eyes: Negative for pain, itching, visual disturbance and dryness.  Respiratory: Negative for cough, hemoptysis, shortness of breath and difficulty breathing.   Cardiovascular: Negative for chest pain, palpitations, hypertension and swelling in legs/feet.  Gastrointestinal: Negative for abdominal pain, blood in stool, constipation, diarrhea, nausea and vomiting.  Endocrine: Negative for increased urination.  Genitourinary: Negative for painful urination and pelvic pain.  Musculoskeletal: Positive for arthralgias and joint  pain. Negative for joint swelling, myalgias, muscle weakness, morning stiffness, muscle tenderness and myalgias.  Skin: Negative for color change, pallor, rash, hair loss, nodules/bumps, skin tightness, ulcers and sensitivity to sunlight.  Allergic/Immunologic: Negative for susceptible to infections.  Neurological: Negative for dizziness, numbness, headaches, memory loss and weakness.  Hematological: Negative for swollen glands.  Psychiatric/Behavioral: Negative for depressed mood, confusion and sleep disturbance. The patient is not nervous/anxious.     PMFS History:  Patient Active Problem List   Diagnosis Date Noted  . Primary insomnia 01/16/2017  . Rheumatoid arthritis of multiple sites with negative rheumatoid factor ([place]) 01/05/2017  . High risk medication use 01/05/2017  . Trochanteric bursitis of both hips 01/05/2017  . Palpitations 10/06/2013  . Cardiac murmur 10/06/2013  . Essential hypertension 10/06/2013  . Hyperlipidemia 10/06/2013  . S/P myomectomy 03/14/2012  . Obesity 01/19/2012  . Fibroids 01/19/2012  . Pelvic pain 01/19/2012    Past Medical History:  Diagnosis Date  . Cardiac murmur   . Fibroid uterus 2001  . H/O abdominal pain 04/24/2011  . H/O dysmenorrhea 2001  . H/O hemorrhoids 2006  . H/O: menorrhagia 2001  . Headache(784.0)   . High cholesterol 2003  . 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 Hypertension   . Irregular periods/menstrual cycles 1999  . Monilial vaginitis 2003  . Palpitations   . Pelvic pain in female 11/25/2011  . PONV (postoperative nausea and vomiting)   . Sinusitis 2009  . Stress 2004  . Yeast vaginitis 2004    Family History  Problem  Relation Age of Onset  . Heart failure Mother   . Cancer Father        Colon  . Breast cancer Neg Hx    Past Surgical History:  Procedure Laterality Date  . BREAST BIOPSY Left 2016  . BUNIONECTOMY    . GANGLION CYST EXCISION    . MYOMECTOMY  12/09/2011   Procedure: MYOMECTOMY;  Surgeon: Ena Dawley,  MD;  Location: [facility];  Service: Gynecology;  Laterality: N/A;  Abdominal Myomectomy, Biopsy abdominal Myometrium   Social History   Social History Narrative  . Not on file    Objective: Vital Signs: BP (!) 150/90 (BP Location: Left Arm, Patient Position: Sitting, Cuff Size: Normal)   Pulse 78   Resp 15   Ht 5\' 2"  (1.575 m)   Wt 194 lb (88 kg)   LMP 12/27/2013 Comment: perimenopausa;  BMI 35.48 kg/m    Physical Exam  Constitutional: She is oriented to person, place, and time. She appears well-developed and well-nourished.  HENT:  Head: Normocephalic and atraumatic.  Eyes: Conjunctivae and EOM are normal.  Neck: Normal range of motion.  Cardiovascular: Normal rate, regular rhythm, normal heart sounds and intact distal pulses.  Pulmonary/Chest: Effort normal and breath sounds normal.  Abdominal: Soft. Bowel sounds are normal.  Lymphadenopathy:    She has no cervical adenopathy.  Neurological: She is alert and oriented to person, place, and time.  Skin: Skin is warm and dry. Capillary refill takes less than 2 seconds.  Psychiatric: She has a normal mood and affect. Her behavior is normal.  Nursing note and vitals reviewed.    Musculoskeletal Exam: C-spine, thoracic spine, lumbar spine good range of motion.  No midline spinal tenderness.  No SI joint tenderness.  Shoulder joints, elbow joints, wrist joints, MCPs, PIPs, DIPs good range of motion with no synovitis.  She is complete fist formation bilaterally.  Hip joints, knee joints, ankle joints, MTPs, PIPs, DIPs good range of motion no synovitis.  No warmth or effusion of bilateral knee joints.  Tenderness of bilateral trochanteric bursa.  CDAI Exam: CDAI Homunculus Exam:   Joint Counts:  CDAI Tender Joint count: 0 CDAI Swollen Joint count: 0  Global Assessments:  Patient Global Assessment: 3 Provider Global Assessment: 3  CDAI Calculated Score: 6   Investigation: No additional findings.  Imaging: Mm Digital  Screening Bilateral  Result Date: 04/19/2018 CLINICAL DATA:  Screening. EXAM: DIGITAL SCREENING BILATERAL MAMMOGRAM WITH CAD COMPARISON:  Previous exam(s). ACR Breast Density Category b: There are scattered areas of fibroglandular density. FINDINGS: There are no findings suspicious for malignancy. Images were processed with CAD. IMPRESSION: No mammographic evidence of malignancy. A result letter of this screening mammogram will be mailed directly to the patient. RECOMMENDATION: Screening mammogram in one year. (Code:SM-B-01Y) BI-RADS CATEGORY  1: Negative. Electronically Signed   By: Fidela Salisbury M.D.   On: 04/19/2018 11:48    Recent Labs: Lab Results  Component Value Date   WBC 4.2 11/17/2017   HGB 11.6 (L) 11/17/2017   PLT 243 11/17/2017   NA 142 11/17/2017   K 3.7 11/17/2017   CL 105 11/17/2017   CO2 30 11/17/2017   GLUCOSE 94 11/17/2017   BUN 9 11/17/2017   CREATININE 0.64 11/17/2017   BILITOT 0.4 11/17/2017   ALKPHOS 49 01/19/2017   AST 15 11/17/2017   ALT 15 11/17/2017   PROT 7.2 11/17/2017   ALBUMIN 4.2 01/19/2017   CALCIUM 9.6 11/17/2017   GFRAA 117 11/17/2017  Speciality Comments: No specialty comments available.  Procedures:  No procedures performed Allergies: Latex and Penicillins   Assessment / Plan:     Visit Diagnoses: Rheumatoid arthritis of multiple sites with negative rheumatoid factor ([place]): She has no active synovitis on exam.  She has not had any recent rheumatoid arthritis flares.  She has no joint pain or joint swelling at this time.  She is clinically doing well on Plaquenil 200 mg 1 tablet twice daily Monday through Friday.  A refill of Plaquenil sent to the pharmacy today.  She is advised to notify us if she develops increased joint pain or joint swelling.  She will return to the office in 5 months.  We will also schedule an ultrasound of bilateral hands to track disease progression.  High risk medication use - PLQ. eye exam by Dr. Jerline Pain in June  2019 per patient. CBC and CMP labs ordered today.  She will have lab work at her return office visit to monitor for drug toxicity.  - Plan: COMPLETE METABOLIC PANEL WITH GFR, CBC with Differential/Platelet  Other fatigue: Improved   Primary insomnia: Improved  Trochanteric bursitis of both hips: She has tenderness of bilateral trochanteric bursa. She perform stretching exercises on a regular basis.  She has not noticed any benefit since doing a stretching exercises.  She declined physical therapy at this time.  She declined cortisone injections.  Vitamin D deficiency: She is no longer taking vitamin D.  Her vitamin D level was checked in Feb 2019 and it was 97.  Other medical conditions are listed as follows:  Mixed hyperlipidemia - She is following up with PCP  History of obesity  History of hypertension   Orders: Orders Placed This Encounter  Procedures  . COMPLETE METABOLIC PANEL WITH GFR  . CBC with Differential/Platelet   Meds ordered this encounter  Medications  . hydroxychloroquine (PLAQUENIL) 200 MG tablet    Sig: Take 1 tablet (200 mg total) by mouth 2 (two) times daily. Monday through Friday    Dispense:  120 tablet    Refill:  0      Follow-Up Instructions: Return in about 5 months (around 09/28/2018) for Rheumatoid arthritis.   Ofilia Neas, PA-C  I examined and evaluated the patient with Hazel Sams PA.  Patient had no synovitis on my examination today.  She has been having some tenderness over left wrist joint.  We will schedule ultrasound of bilateral hands.  The plan of care was discussed as noted above.  Bo Merino, MD  Note - This record has been created using Editor, commissioning.  Chart creation errors have been sought, but may not always  have been located. Such creation errors do not reflect on  the standard of medical care.

## 2018-04-18 ENCOUNTER — Ambulatory Visit
Admission: RE | Admit: 2018-04-18 | Discharge: 2018-04-18 | Disposition: A | Discharge status: Home / Self Care | Payer: BLUE CROSS/BLUE SHIELD | Source: Ambulatory Visit | Attending: Obstetrics and Gynecology | Admitting: Obstetrics and Gynecology

## 2018-04-18 DIAGNOSIS — Z1231 Encounter for screening mammogram for malignant neoplasm of breast: Secondary | ICD-10-CM

## 2018-04-28 ENCOUNTER — Encounter: Payer: Self-pay | Admitting: Rheumatology

## 2018-04-28 ENCOUNTER — Ambulatory Visit: Payer: BLUE CROSS/BLUE SHIELD | Admitting: Rheumatology

## 2018-04-28 VITALS — BP 144/86 | HR 74 | Resp 15 | Ht 62.0 in | Wt 194.0 lb

## 2018-04-28 DIAGNOSIS — Z8679 Personal history of other diseases of the circulatory system: Secondary | ICD-10-CM

## 2018-04-28 DIAGNOSIS — Z79899 Other long term (current) drug therapy: Secondary | ICD-10-CM | POA: Diagnosis not present

## 2018-04-28 DIAGNOSIS — R5383 Other fatigue: Secondary | ICD-10-CM

## 2018-04-28 DIAGNOSIS — M0609 Rheumatoid arthritis without rheumatoid factor, multiple sites: Secondary | ICD-10-CM

## 2018-04-28 DIAGNOSIS — E782 Mixed hyperlipidemia: Secondary | ICD-10-CM

## 2018-04-28 DIAGNOSIS — E559 Vitamin D deficiency, unspecified: Secondary | ICD-10-CM

## 2018-04-28 DIAGNOSIS — M7062 Trochanteric bursitis, left hip: Secondary | ICD-10-CM

## 2018-04-28 DIAGNOSIS — Z8639 Personal history of other endocrine, nutritional and metabolic disease: Secondary | ICD-10-CM

## 2018-04-28 DIAGNOSIS — F5101 Primary insomnia: Secondary | ICD-10-CM

## 2018-04-28 DIAGNOSIS — M7061 Trochanteric bursitis, right hip: Secondary | ICD-10-CM

## 2018-04-28 LAB — CBC WITH DIFFERENTIAL/PLATELET
BASOS PCT: 1.4 %
Basophils Absolute: 52 cells/uL (ref 0–200)
EOS PCT: 4.3 %
Eosinophils Absolute: 159 cells/uL (ref 15–500)
HCT: 35.8 % (ref 35.0–45.0)
HEMOGLOBIN: 11.8 g/dL (ref 11.7–15.5)
Lymphs Abs: 1521 cells/uL (ref 850–3900)
MCH: 26.6 pg — ABNORMAL LOW (ref 27.0–33.0)
MCHC: 33 g/dL (ref 32.0–36.0)
MCV: 80.6 fL (ref 80.0–100.0)
MONOS PCT: 11.4 %
MPV: 11.6 fL (ref 7.5–12.5)
NEUTROS ABS: 1547 {cells}/uL (ref 1500–7800)
Neutrophils Relative %: 41.8 %
PLATELETS: 241 10*3/uL (ref 140–400)
RBC: 4.44 10*6/uL (ref 3.80–5.10)
RDW: 13.1 % (ref 11.0–15.0)
TOTAL LYMPHOCYTE: 41.1 %
WBC mixed population: 422 cells/uL (ref 200–950)
WBC: 3.7 10*3/uL — ABNORMAL LOW (ref 3.8–10.8)

## 2018-04-28 LAB — COMPLETE METABOLIC PANEL WITH GFR
AG Ratio: 1.9 (calc) (ref 1.0–2.5)
ALT: 16 U/L (ref 6–29)
AST: 16 U/L (ref 10–35)
Albumin: 4.5 g/dL (ref 3.6–5.1)
Alkaline phosphatase (APISO): 63 U/L (ref 33–130)
BUN: 11 mg/dL (ref 7–25)
CALCIUM: 9.2 mg/dL (ref 8.6–10.4)
CO2: 29 mmol/L (ref 20–32)
CREATININE: 0.6 mg/dL (ref 0.50–1.05)
Chloride: 108 mmol/L (ref 98–110)
GFR, EST NON AFRICAN AMERICAN: 103 mL/min/{1.73_m2} (ref >=60)
GFR, Est African American: 120 mL/min/{1.73_m2} (ref >=60)
Globulin: 2.4 g/dL (calc) (ref 1.9–3.7)
Glucose, Bld: 92 mg/dL (ref 65–99)
Potassium: 4.1 mmol/L (ref 3.5–5.3)
Sodium: 142 mmol/L (ref 135–146)
TOTAL PROTEIN: 6.9 g/dL (ref 6.1–8.1)
Total Bilirubin: 0.5 mg/dL (ref 0.2–1.2)

## 2018-04-28 MED ORDER — HYDROXYCHLOROQUINE SULFATE 200 MG PO TABS: 200.0000 mg | ORAL_TABLET | Freq: Two times a day (BID) | ORAL | 0 refills | Status: DC

## 2018-04-29 NOTE — Progress Notes (Signed)
WBC count borderline low.  All other labs are WNL.  We will continue to monitor.

## 2018-05-11 ENCOUNTER — Other Ambulatory Visit: Payer: BLUE CROSS/BLUE SHIELD | Admitting: Rheumatology

## 2018-05-11 DIAGNOSIS — L03114 Cellulitis of left upper limb: Secondary | ICD-10-CM | POA: Diagnosis not present

## 2018-05-11 DIAGNOSIS — S50862A Insect bite (nonvenomous) of left forearm, initial encounter: Secondary | ICD-10-CM | POA: Diagnosis not present

## 2018-05-11 DIAGNOSIS — W57XXXA Bitten or stung by nonvenomous insect and other nonvenomous arthropods, initial encounter: Secondary | ICD-10-CM | POA: Diagnosis not present

## 2018-06-17 DIAGNOSIS — S1086XA Insect bite of other specified part of neck, initial encounter: Secondary | ICD-10-CM | POA: Diagnosis not present

## 2018-07-13 ENCOUNTER — Ambulatory Visit (INDEPENDENT_AMBULATORY_CARE_PROVIDER_SITE_OTHER): Payer: Self-pay

## 2018-07-13 ENCOUNTER — Ambulatory Visit (INDEPENDENT_AMBULATORY_CARE_PROVIDER_SITE_OTHER): Payer: BLUE CROSS/BLUE SHIELD | Admitting: Rheumatology

## 2018-07-13 DIAGNOSIS — M79642 Pain in left hand: Secondary | ICD-10-CM | POA: Diagnosis not present

## 2018-07-13 DIAGNOSIS — M79641 Pain in right hand: Secondary | ICD-10-CM

## 2018-08-01 DIAGNOSIS — E785 Hyperlipidemia, unspecified: Secondary | ICD-10-CM | POA: Diagnosis not present

## 2018-08-01 DIAGNOSIS — R42 Dizziness and giddiness: Secondary | ICD-10-CM | POA: Diagnosis not present

## 2018-08-01 DIAGNOSIS — J309 Allergic rhinitis, unspecified: Secondary | ICD-10-CM | POA: Diagnosis not present

## 2018-08-01 DIAGNOSIS — I10 Essential (primary) hypertension: Secondary | ICD-10-CM | POA: Diagnosis not present

## 2018-08-01 DIAGNOSIS — Z23 Encounter for immunization: Secondary | ICD-10-CM | POA: Diagnosis not present

## 2018-09-16 NOTE — Progress Notes (Deleted)
Office Visit Note  Patient: Stacey Turner             Date of Birth: 03/01/1961           MRN: [id number]             PCP: [name], MD Referring: [name], MD Visit Date: 09/29/2018 Occupation: @GUAROCC @  Subjective:  No chief complaint on file.   History of Present Illness: Stacey Turner is a 57 y.o. female ***   Activities of Daily Living:  Patient reports morning stiffness for *** {minute/hour:19697}.   Patient {ACTIONS;DENIES/REPORTS:21021675::"Denies"} nocturnal pain.  Difficulty dressing/grooming: {ACTIONS;DENIES/REPORTS:21021675::"Denies"} Difficulty climbing stairs: {ACTIONS;DENIES/REPORTS:21021675::"Denies"} Difficulty getting out of chair: {ACTIONS;DENIES/REPORTS:21021675::"Denies"} Difficulty using hands for taps, buttons, cutlery, and/or writing: {ACTIONS;DENIES/REPORTS:21021675::"Denies"}  No Rheumatology ROS completed.   PMFS History:  Patient Active Problem List   Diagnosis Date Noted  . Primary insomnia 01/16/2017  . Rheumatoid arthritis of multiple sites with negative rheumatoid factor ([place]) 01/05/2017  . High risk medication use 01/05/2017  . Trochanteric bursitis of both hips 01/05/2017  . Palpitations 10/06/2013  . Cardiac murmur 10/06/2013  . Essential hypertension 10/06/2013  . Hyperlipidemia 10/06/2013  . S/P myomectomy 03/14/2012  . Obesity 01/19/2012  . Fibroids 01/19/2012  . Pelvic pain 01/19/2012    Past Medical History:  Diagnosis Date  . Cardiac murmur   . Fibroid uterus 2001  . H/O abdominal pain 04/24/2011  . H/O dysmenorrhea 2001  . H/O hemorrhoids 2006  . H/O: menorrhagia 2001  . Headache(784.0)   . High cholesterol 2003  . 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 Hypertension   . Irregular periods/menstrual cycles 1999  . Monilial vaginitis 2003  . Palpitations   . Pelvic pain in female 11/25/2011  . PONV (postoperative nausea and vomiting)   . Sinusitis 2009  . Stress 2004  . Yeast vaginitis 2004    Family History    Problem Relation Age of Onset  . Heart failure Mother   . Cancer Father        Colon  . Breast cancer Neg Hx    Past Surgical History:  Procedure Laterality Date  . BREAST BIOPSY Left 2016  . BUNIONECTOMY    . GANGLION CYST EXCISION    . MYOMECTOMY  12/09/2011   Procedure: MYOMECTOMY;  Surgeon: Ena Dawley, MD;  Location: [facility];  Service: Gynecology;  Laterality: N/A;  Abdominal Myomectomy, Biopsy abdominal Myometrium   Social History   Social History Narrative  . Not on file    Objective: Vital Signs: LMP 12/27/2013 Comment: perimenopausa;   Physical Exam   Musculoskeletal Exam: ***  CDAI Exam: CDAI Score: Not documented Patient Global Assessment: Not documented; Provider Global Assessment: Not documented Swollen: Not documented; Tender: Not documented Joint Exam   Not documented   There is currently no information documented on the homunculus. Go to the Rheumatology activity and complete the homunculus joint exam.  Investigation: No additional findings.  Imaging: No results found.  Recent Labs: Lab Results  Component Value Date   WBC 3.7 (L) 04/28/2018   HGB 11.8 04/28/2018   PLT 241 04/28/2018   NA 142 04/28/2018   K 4.1 04/28/2018   CL 108 04/28/2018   CO2 29 04/28/2018   GLUCOSE 92 04/28/2018   BUN 11 04/28/2018   CREATININE 0.60 04/28/2018   BILITOT 0.5 04/28/2018   ALKPHOS 49 01/19/2017   AST 16 04/28/2018   ALT 16 04/28/2018   PROT 6.9 04/28/2018   ALBUMIN 4.2 01/19/2017   CALCIUM  9.2 04/28/2018   GFRAA 120 04/28/2018    Speciality Comments: No specialty comments available.  Procedures:  No procedures performed Allergies: Latex and Penicillins   Assessment / Plan:     Visit Diagnoses: No diagnosis found.   Orders: No orders of the defined types were placed in this encounter.  No orders of the defined types were placed in this encounter.   Face-to-face time spent with patient was *** minutes. Greater than 50% of time was  spent in counseling and coordination of care.  Follow-Up Instructions: No follow-ups on file.   Earnestine Mealing, CMA  Note - This record has been created using Editor, commissioning.  Chart creation errors have been sought, but may not always  have been located. Such creation errors do not reflect on  the standard of medical care.

## 2018-09-29 ENCOUNTER — Ambulatory Visit: Payer: BLUE CROSS/BLUE SHIELD | Admitting: Rheumatology

## 2018-10-20 ENCOUNTER — Other Ambulatory Visit: Payer: Self-pay | Admitting: Physician Assistant

## 2018-10-20 NOTE — Progress Notes (Deleted)
Office Visit Note  Patient: Stacey Turner             Date of Birth: [birth year]           MRN: [id number]             PCP: [name], MD Referring: [name], MD Visit Date: 11/03/2018 Occupation: @GUAROCC @  Subjective:  No chief complaint on file.  Plaquenil 200 mg twice daily Monday through Friday.  Last Plaquenil eye exam normal in June 2019 per patient.  Most recent CBC/CMP within normal limits except for borderline low WBC on 04/28/2018. Due for CBC/CMP today and will monitor every 5 months.  History of Present Illness: Stacey Turner is a 58 y.o. female ***   Activities of Daily Living:  Patient reports morning stiffness for *** {minute/hour:19697}.   Patient {ACTIONS;DENIES/REPORTS:21021675::"Denies"} nocturnal pain.  Difficulty dressing/grooming: {ACTIONS;DENIES/REPORTS:21021675::"Denies"} Difficulty climbing stairs: {ACTIONS;DENIES/REPORTS:21021675::"Denies"} Difficulty getting out of chair: {ACTIONS;DENIES/REPORTS:21021675::"Denies"} Difficulty using hands for taps, buttons, cutlery, and/or writing: {ACTIONS;DENIES/REPORTS:21021675::"Denies"}  No Rheumatology ROS completed.   PMFS History:  Patient Active Problem List   Diagnosis Date Noted  . Primary insomnia 01/16/2017  . Rheumatoid arthritis of multiple sites with negative rheumatoid factor ([place]) 01/05/2017  . High risk medication use 01/05/2017  . Trochanteric bursitis of both hips 01/05/2017  . Palpitations 10/06/2013  . Cardiac murmur 10/06/2013  . Essential hypertension 10/06/2013  . Hyperlipidemia 10/06/2013  . S/P myomectomy 03/14/2012  . Obesity 01/19/2012  . Fibroids 01/19/2012  . Pelvic pain 01/19/2012    Past Medical History:  Diagnosis Date  . Cardiac murmur   . Fibroid uterus 2001  . H/O abdominal pain 04/24/2011  . H/O dysmenorrhea 2001  . H/O hemorrhoids 2006  . H/O: menorrhagia 2001  . Headache(784.0)   . High cholesterol 2003  . 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 Hypertension   .  Irregular periods/menstrual cycles 1999  . Monilial vaginitis 2003  . Palpitations   . Pelvic pain in female 11/25/2011  . PONV (postoperative nausea and vomiting)   . Sinusitis 2009  . Stress 2004  . Yeast vaginitis 2004    Family History  Problem Relation Age of Onset  . Heart failure Mother   . Cancer Father        Colon  . Breast cancer Neg Hx    Past Surgical History:  Procedure Laterality Date  . BREAST BIOPSY Left 2016  . BUNIONECTOMY    . GANGLION CYST EXCISION    . MYOMECTOMY  12/09/2011   Procedure: MYOMECTOMY;  Surgeon: Ena Dawley, MD;  Location: [facility];  Service: Gynecology;  Laterality: N/A;  Abdominal Myomectomy, Biopsy abdominal Myometrium   Social History   Social History Narrative  . Not on file    There is no immunization history on file for this patient.   Objective: Vital Signs: LMP 12/27/2013 Comment: perimenopausa;   Physical Exam   Musculoskeletal Exam: ***  CDAI Exam: CDAI Score: Not documented Patient Global Assessment: Not documented; Provider Global Assessment: Not documented Swollen: Not documented; Tender: Not documented Joint Exam   Not documented   There is currently no information documented on the homunculus. Go to the Rheumatology activity and complete the homunculus joint exam.  Investigation: No additional findings.  Imaging: No results found.  Recent Labs: Lab Results  Component Value Date   WBC 3.7 (L) 04/28/2018   HGB 11.8 04/28/2018   PLT 241 04/28/2018   NA 142 04/28/2018   K 4.1 04/28/2018   CL 108  04/28/2018   CO2 29 04/28/2018   GLUCOSE 92 04/28/2018   BUN 11 04/28/2018   CREATININE 0.60 04/28/2018   BILITOT 0.5 04/28/2018   ALKPHOS 49 01/19/2017   AST 16 04/28/2018   ALT 16 04/28/2018   PROT 6.9 04/28/2018   ALBUMIN 4.2 01/19/2017   CALCIUM 9.2 04/28/2018   GFRAA 120 04/28/2018    Speciality Comments: No specialty comments available.  Procedures:  No procedures performed Allergies: Latex  and Penicillins   Assessment / Plan:     Visit Diagnoses: No diagnosis found.   Orders: No orders of the defined types were placed in this encounter.  No orders of the defined types were placed in this encounter.   Face-to-face time spent with patient was *** minutes. Greater than 50% of time was spent in counseling and coordination of care.  Follow-Up Instructions: No follow-ups on file.   Earnestine Mealing, CMA  Note - This record has been created using Editor, commissioning.  Chart creation errors have been sought, but may not always  have been located. Such creation errors do not reflect on  the standard of medical care.

## 2018-10-20 NOTE — Telephone Encounter (Signed)
Last Visit: 04/28/18 Next Visit: 11/03/18 Labs: 04/28/18 WBC count borderline low. All other labs are WNL. We will continue to monitor. Eye exam: June 2019   Okay to refill per Dr. Estanislado Pandy

## 2018-11-03 ENCOUNTER — Ambulatory Visit: Payer: BLUE CROSS/BLUE SHIELD | Admitting: Rheumatology

## 2018-11-10 NOTE — Progress Notes (Signed)
Office Visit Note  Patient: Stacey Turner             Date of Birth: Nov 08, 1960           MRN: [id number]             PCP: [name], MD Referring: [name], MD Visit Date: 11/22/2018 Occupation: @GUAROCC @  Subjective:  Medication monitoring   History of Present Illness: Stacey Turner is a 58 y.o. female with history of seronegative rheumatoid arthritis.  She is on PLQ 200 mg BID M-F.  She denies any recent rheumatoid arthritis flares.  She states that overall she has been doing well.  She denies any joint pain or joint swelling at this time.  She denies any morning stiffness.  She states that she continues to have mild bilateral trochanter bursitis.  She denies any recent infections.  She denies any concerns at this time.  She would like a refill of Plaquenil today.   Activities of Daily Living:  Patient reports morning stiffness for 0 minutes.   Patient Denies nocturnal pain.  Difficulty dressing/grooming: Denies Difficulty climbing stairs: Denies Difficulty getting out of chair: Denies Difficulty using hands for taps, buttons, cutlery, and/or writing: Denies  Review of Systems  Constitutional: Positive for fatigue.  HENT: Negative for mouth sores, mouth dryness and nose dryness.   Eyes: Negative for pain, visual disturbance and dryness.  Respiratory: Negative for cough, hemoptysis, shortness of breath and difficulty breathing.   Cardiovascular: Negative for chest pain, palpitations, hypertension and swelling in legs/feet.  Gastrointestinal: Negative for blood in stool, constipation and diarrhea.  Endocrine: Negative for increased urination.  Genitourinary: Negative for painful urination.  Musculoskeletal: Negative for arthralgias, joint pain, joint swelling, myalgias, muscle weakness, morning stiffness, muscle tenderness and myalgias.  Skin: Negative for color change, pallor, rash, hair loss, nodules/bumps, skin tightness, ulcers and sensitivity to sunlight.    Allergic/Immunologic: Negative for susceptible to infections.  Neurological: Negative for dizziness, numbness, headaches and weakness.  Hematological: Negative for swollen glands.  Psychiatric/Behavioral: Positive for sleep disturbance. Negative for depressed mood. The patient is not nervous/anxious.     PMFS History:  Patient Active Problem List   Diagnosis Date Noted  . Primary insomnia 01/16/2017  . Rheumatoid arthritis of multiple sites with negative rheumatoid factor ([place]) 01/05/2017  . High risk medication use 01/05/2017  . Trochanteric bursitis of both hips 01/05/2017  . Palpitations 10/06/2013  . Cardiac murmur 10/06/2013  . Essential hypertension 10/06/2013  . Hyperlipidemia 10/06/2013  . S/P myomectomy 03/14/2012  . Obesity 01/19/2012  . Fibroids 01/19/2012  . Pelvic pain 01/19/2012    Past Medical History:  Diagnosis Date  . Cardiac murmur   . Fibroid uterus 2001  . H/O abdominal pain 04/24/2011  . H/O dysmenorrhea 2001  . H/O hemorrhoids 2006  . H/O: menorrhagia 2001  . Headache(784.0)   . High cholesterol 2003  . 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 Hypertension   . Irregular periods/menstrual cycles 1999  . Monilial vaginitis 2003  . Palpitations   . Pelvic pain in female 11/25/2011  . PONV (postoperative nausea and vomiting)   . Sinusitis 2009  . Stress 2004  . Yeast vaginitis 2004    Family History  Problem Relation Age of Onset  . Heart failure Mother   . Cancer Father        Colon  . Breast cancer Neg Hx    Past Surgical History:  Procedure Laterality Date  . BREAST BIOPSY Left 2016  .  BUNIONECTOMY    . GANGLION CYST EXCISION    . MYOMECTOMY  12/09/2011   Procedure: MYOMECTOMY;  Surgeon: Ena Dawley, MD;  Location: [facility];  Service: Gynecology;  Laterality: N/A;  Abdominal Myomectomy, Biopsy abdominal Myometrium   Social History   Social History Narrative  . Not on file    There is no immunization history on file for this patient.    Objective: Vital Signs: BP 138/75 (BP Location: Left Arm, Patient Position: Sitting, Cuff Size: Large)   Pulse 73   Resp 15   Ht 5\' 2"  (1.575 m)   Wt 195 lb (88.5 kg)   LMP 12/27/2013 Comment: perimenopausa;  BMI 35.67 kg/m    Physical Exam Vitals signs and nursing note reviewed.  Constitutional:      Appearance: She is well-developed.  HENT:     Head: Normocephalic and atraumatic.  Eyes:     Conjunctiva/sclera: Conjunctivae normal.  Neck:     Musculoskeletal: Normal range of motion.  Cardiovascular:     Rate and Rhythm: Normal rate and regular rhythm.     Heart sounds: Normal heart sounds.  Pulmonary:     Effort: Pulmonary effort is normal.     Breath sounds: Normal breath sounds.  Abdominal:     General: Bowel sounds are normal.     Palpations: Abdomen is soft.  Lymphadenopathy:     Cervical: No cervical adenopathy.  Skin:    General: Skin is warm and dry.     Capillary Refill: Capillary refill takes less than 2 seconds.  Neurological:     Mental Status: She is alert and oriented to person, place, and time.  Psychiatric:        Behavior: Behavior normal.      Musculoskeletal Exam: C-spine, thoracic spine, lumbar spine good range of motion.  No midline spinal tenderness.  No SI joint tenderness.  Shoulder joints, elbow joints, wrist joints, MCPs, PIPs, DIPs good range of motion with no synovitis.  She has complete fist formation bilaterally.  Hip joints good range of motion with no discomfort.  She has mild tenderness over bilateral trochanteric bursa and IT bands.  Knee joints, ankle joints, MTPs, PIPs, DIPs good range of motion with no synovitis.  No warmth or effusion of bilateral knee joints.  No tenderness or swelling of ankle joints.  CDAI Exam: CDAI Score: 0.8  Patient Global Assessment: 4 (mm); Provider Global Assessment: 4 (mm) Swollen: 0 ; Tender: 0  Joint Exam   Not documented   There is currently no information documented on the homunculus. Go to  the Rheumatology activity and complete the homunculus joint exam.  Investigation: No additional findings.  Imaging: No results found.  Recent Labs: Lab Results  Component Value Date   WBC 3.7 (L) 04/28/2018   HGB 11.8 04/28/2018   PLT 241 04/28/2018   NA 142 04/28/2018   K 4.1 04/28/2018   CL 108 04/28/2018   CO2 29 04/28/2018   GLUCOSE 92 04/28/2018   BUN 11 04/28/2018   CREATININE 0.60 04/28/2018   BILITOT 0.5 04/28/2018   ALKPHOS 49 01/19/2017   AST 16 04/28/2018   ALT 16 04/28/2018   PROT 6.9 04/28/2018   ALBUMIN 4.2 01/19/2017   CALCIUM 9.2 04/28/2018   GFRAA 120 04/28/2018    Speciality Comments: No specialty comments available.  Procedures:  No procedures performed Allergies: Latex and Penicillins   Assessment / Plan:     Visit Diagnoses: Rheumatoid arthritis of multiple sites with negative rheumatoid factor (  [place]): She has no synovitis on exam.  She has not had any recent rheumatoid arthritis flares.  She has no joint pain or joint swelling.  She has no morning stiffness.  She is clinically doing well on Plaquenil 200 mg BID M-F.  She has not missed any doses and is tolerating it well. She will continue on this current treatment regimen. A refill was sent to the pharmacy. She was advised to notify us if she develops increased joint pain or joint swelling.  She will follow up in 5 months.   High risk medication use - PLQ. eye exam: Dr. Jerline Pain in June 2019 per patient.  CBC and CMP will be drawn today to monitor for drug toxicity. - Plan: COMPLETE METABOLIC PANEL WITH GFR, CBC with Differential/Platelet  Primary insomnia: She has chronic insomnia.    Other fatigue: She has chronic fatigue related to insomnia.   Trochanteric bursitis of both hips - She has mild tenderness of bilateral trochanteric bursa. She declined physical therapy in the past.  She declined cortisone injections.  She was given a handout of exercises to perform.   Other medical conditions are  listed as follows:   Vitamin D deficiency  History of hypertension  Mixed hyperlipidemia - She is following up with PCP  History of obesity   Orders: Orders Placed This Encounter  Procedures  . COMPLETE METABOLIC PANEL WITH GFR  . CBC with Differential/Platelet   Meds ordered this encounter  Medications  . hydroxychloroquine (PLAQUENIL) 200 MG tablet    Sig: Take 1 tablet (200 mg) by mouth twice daily Monday through Friday only.    Dispense:  40 tablet    Refill:  2     Follow-Up Instructions: Return in about 5 months (around 04/24/2019) for Rheumatoid arthritis.   Ofilia Neas, PA-C   I examined and evaluated the patient with Hazel Sams PA.  Patient had no synovitis on my examination today.  The plan of care was discussed as noted above.  Bo Merino, MD  Note - This record has been created using Editor, commissioning.  Chart creation errors have been sought, but may not always  have been located. Such creation errors do not reflect on  the standard of medical care.

## 2018-11-22 ENCOUNTER — Encounter: Payer: Self-pay | Admitting: Rheumatology

## 2018-11-22 ENCOUNTER — Ambulatory Visit: Payer: BLUE CROSS/BLUE SHIELD | Admitting: Rheumatology

## 2018-11-22 VITALS — BP 138/75 | HR 73 | Resp 15 | Ht 62.0 in | Wt 195.0 lb

## 2018-11-22 DIAGNOSIS — M7062 Trochanteric bursitis, left hip: Secondary | ICD-10-CM

## 2018-11-22 DIAGNOSIS — E782 Mixed hyperlipidemia: Secondary | ICD-10-CM

## 2018-11-22 DIAGNOSIS — F5101 Primary insomnia: Secondary | ICD-10-CM

## 2018-11-22 DIAGNOSIS — M0609 Rheumatoid arthritis without rheumatoid factor, multiple sites: Secondary | ICD-10-CM

## 2018-11-22 DIAGNOSIS — M7061 Trochanteric bursitis, right hip: Secondary | ICD-10-CM

## 2018-11-22 DIAGNOSIS — R5383 Other fatigue: Secondary | ICD-10-CM

## 2018-11-22 DIAGNOSIS — Z8639 Personal history of other endocrine, nutritional and metabolic disease: Secondary | ICD-10-CM

## 2018-11-22 DIAGNOSIS — Z8679 Personal history of other diseases of the circulatory system: Secondary | ICD-10-CM

## 2018-11-22 DIAGNOSIS — Z79899 Other long term (current) drug therapy: Secondary | ICD-10-CM

## 2018-11-22 DIAGNOSIS — E559 Vitamin D deficiency, unspecified: Secondary | ICD-10-CM

## 2018-11-22 MED ORDER — HYDROXYCHLOROQUINE SULFATE 200 MG PO TABS: ORAL_TABLET | ORAL | 2 refills | Status: DC

## 2018-11-22 NOTE — Patient Instructions (Signed)

## 2018-11-23 LAB — COMPLETE METABOLIC PANEL WITH GFR
AG RATIO: 1.7 (calc) (ref 1.0–2.5)
ALT: 14 U/L (ref 6–29)
AST: 15 U/L (ref 10–35)
Albumin: 4.5 g/dL (ref 3.6–5.1)
Alkaline phosphatase (APISO): 51 U/L (ref 37–153)
BILIRUBIN TOTAL: 0.5 mg/dL (ref 0.2–1.2)
BUN: 12 mg/dL (ref 7–25)
CALCIUM: 9.6 mg/dL (ref 8.6–10.4)
CHLORIDE: 106 mmol/L (ref 98–110)
CO2: 27 mmol/L (ref 20–32)
Creat: 0.86 mg/dL (ref 0.50–1.05)
GFR, EST AFRICAN AMERICAN: 88 mL/min/{1.73_m2} (ref >=60)
GFR, Est Non African American: 76 mL/min/{1.73_m2} (ref >=60)
GLUCOSE: 81 mg/dL (ref 65–99)
Globulin: 2.7 g/dL (calc) (ref 1.9–3.7)
POTASSIUM: 3.8 mmol/L (ref 3.5–5.3)
Sodium: 142 mmol/L (ref 135–146)
TOTAL PROTEIN: 7.2 g/dL (ref 6.1–8.1)

## 2018-11-23 LAB — CBC WITH DIFFERENTIAL/PLATELET
Absolute Monocytes: 368 cells/uL (ref 200–950)
BASOS PCT: 1.2 %
Basophils Absolute: 48 cells/uL (ref 0–200)
EOS PCT: 4.7 %
Eosinophils Absolute: 188 cells/uL (ref 15–500)
HCT: 36.9 % (ref 35.0–45.0)
HEMOGLOBIN: 12.3 g/dL (ref 11.7–15.5)
Lymphs Abs: 1316 cells/uL (ref 850–3900)
MCH: 27 pg (ref 27.0–33.0)
MCHC: 33.3 g/dL (ref 32.0–36.0)
MCV: 81.1 fL (ref 80.0–100.0)
MPV: 11.7 fL (ref 7.5–12.5)
Monocytes Relative: 9.2 %
Neutro Abs: 2080 cells/uL (ref 1500–7800)
Neutrophils Relative %: 52 %
Platelets: 217 10*3/uL (ref 140–400)
RBC: 4.55 10*6/uL (ref 3.80–5.10)
RDW: 13.1 % (ref 11.0–15.0)
Total Lymphocyte: 32.9 %
WBC: 4 10*3/uL (ref 3.8–10.8)

## 2018-11-23 NOTE — Progress Notes (Signed)
Labs are WNL.

## 2018-12-22 ENCOUNTER — Telehealth: Payer: Self-pay | Admitting: Rheumatology

## 2018-12-22 MED ORDER — HYDROXYCHLOROQUINE SULFATE 200 MG PO TABS: ORAL_TABLET | ORAL | 0 refills | Status: DC

## 2018-12-22 NOTE — Telephone Encounter (Signed)
Last visit: 11/22/18 Next visit: 04/25/19  Okay to refill per Dr. Estanislado Pandy

## 2018-12-22 NOTE — Telephone Encounter (Signed)
Patient called requesting prescription refill of Plaquenil (90 day supply) to be sent to [facility].   Patient states it is less expensive and she can receive a 90 day supply using [facility] instead of [facility].  Patient states [facility] is requesting a new prescription.  Patient states their number is # [phone] for verbal authorization.

## 2019-03-27 DIAGNOSIS — R21 Rash and other nonspecific skin eruption: Secondary | ICD-10-CM | POA: Diagnosis not present

## 2019-03-27 DIAGNOSIS — E785 Hyperlipidemia, unspecified: Secondary | ICD-10-CM | POA: Diagnosis not present

## 2019-03-27 DIAGNOSIS — I10 Essential (primary) hypertension: Secondary | ICD-10-CM | POA: Diagnosis not present

## 2019-03-27 DIAGNOSIS — J3089 Other allergic rhinitis: Secondary | ICD-10-CM | POA: Diagnosis not present

## 2019-03-27 DIAGNOSIS — E669 Obesity, unspecified: Secondary | ICD-10-CM | POA: Diagnosis not present

## 2019-03-28 ENCOUNTER — Telehealth: Payer: Self-pay | Admitting: Rheumatology

## 2019-03-28 DIAGNOSIS — M0609 Rheumatoid arthritis without rheumatoid factor, multiple sites: Secondary | ICD-10-CM

## 2019-03-28 MED ORDER — HYDROXYCHLOROQUINE SULFATE 200 MG PO TABS: ORAL_TABLET | ORAL | 0 refills | Status: DC

## 2019-03-28 NOTE — Telephone Encounter (Addendum)
Last Visit: 11/22/18 Next Visit: 04/25/19  Labs: 11/22/18 WNL Eye exam: June 2019 per patient  Patient advised she is due to update PLQ eye exam. Patient states she was scheduled for 03/27/19 and the doctor was not in the office and her appointment was cancelled. Patient will call to reschedule.  Okay to refill per Dr. Estanislado Pandy

## 2019-03-28 NOTE — Telephone Encounter (Signed)
Patient requesting a refill on Plaquenil sent to [facility]. Please note rx to be sent to [facility], not [facility]. Fax# [phone].

## 2019-04-12 NOTE — Progress Notes (Deleted)
Office Visit Note  Patient: Stacey Turner             Date of Birth: [birth year]           MRN: [id number]             PCP: [name], MD Referring: [name], MD Visit Date: 04/25/2019 Occupation: @GUAROCC @  Subjective:  No chief complaint on file.   History of Present Illness: Stacey Turner is a 58 y.o. female ***   Activities of Daily Living:  Patient reports morning stiffness for *** {minute/hour:19697}.   Patient {ACTIONS;DENIES/REPORTS:21021675::"Denies"} nocturnal pain.  Difficulty dressing/grooming: {ACTIONS;DENIES/REPORTS:21021675::"Denies"} Difficulty climbing stairs: {ACTIONS;DENIES/REPORTS:21021675::"Denies"} Difficulty getting out of chair: {ACTIONS;DENIES/REPORTS:21021675::"Denies"} Difficulty using hands for taps, buttons, cutlery, and/or writing: {ACTIONS;DENIES/REPORTS:21021675::"Denies"}  No Rheumatology ROS completed.   PMFS History:  Patient Active Problem List   Diagnosis Date Noted  . Primary insomnia 01/16/2017  . Rheumatoid arthritis of multiple sites with negative rheumatoid factor ([place]) 01/05/2017  . High risk medication use 01/05/2017  . Trochanteric bursitis of both hips 01/05/2017  . Palpitations 10/06/2013  . Cardiac murmur 10/06/2013  . Essential hypertension 10/06/2013  . Hyperlipidemia 10/06/2013  . S/P myomectomy 03/14/2012  . Obesity 01/19/2012  . Fibroids 01/19/2012  . Pelvic pain 01/19/2012    Past Medical History:  Diagnosis Date  . Cardiac murmur   . Fibroid uterus 2001  . H/O abdominal pain 04/24/2011  . H/O dysmenorrhea 2001  . H/O hemorrhoids 2006  . H/O: menorrhagia 2001  . Headache(784.0)   . High cholesterol 2003  . 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 Hypertension   . Irregular periods/menstrual cycles 1999  . Monilial vaginitis 2003  . Palpitations   . Pelvic pain in female 11/25/2011  . PONV (postoperative nausea and vomiting)   . Sinusitis 2009  . Stress 2004  . Yeast vaginitis 2004    Family History   Problem Relation Age of Onset  . Heart failure Mother   . Cancer Father        Colon  . Breast cancer Neg Hx    Past Surgical History:  Procedure Laterality Date  . BREAST BIOPSY Left 2016  . BUNIONECTOMY    . GANGLION CYST EXCISION    . MYOMECTOMY  12/09/2011   Procedure: MYOMECTOMY;  Surgeon: Ena Dawley, MD;  Location: [facility];  Service: Gynecology;  Laterality: N/A;  Abdominal Myomectomy, Biopsy abdominal Myometrium   Social History   Social History Narrative  . Not on file    There is no immunization history on file for this patient.   Objective: Vital Signs: LMP 12/27/2013 Comment: perimenopausa;   Physical Exam   Musculoskeletal Exam: ***  CDAI Exam: CDAI Score: - Patient Global: -; Provider Global: - Swollen: -; Tender: - Joint Exam   No joint exam has been documented for this visit   There is currently no information documented on the homunculus. Go to the Rheumatology activity and complete the homunculus joint exam.  Investigation: No additional findings.  Imaging: No results found.  Recent Labs: Lab Results  Component Value Date   WBC 4.0 11/22/2018   HGB 12.3 11/22/2018   PLT 217 11/22/2018   NA 142 11/22/2018   K 3.8 11/22/2018   CL 106 11/22/2018   CO2 27 11/22/2018   GLUCOSE 81 11/22/2018   BUN 12 11/22/2018   CREATININE 0.86 11/22/2018   BILITOT 0.5 11/22/2018   ALKPHOS 49 01/19/2017   AST 15 11/22/2018   ALT 14 11/22/2018   PROT  7.2 11/22/2018   ALBUMIN 4.2 01/19/2017   CALCIUM 9.6 11/22/2018   GFRAA 88 11/22/2018    Speciality Comments: No specialty comments available.  Procedures:  No procedures performed Allergies: Latex and Penicillins   Assessment / Plan:     Visit Diagnoses: No diagnosis found.  Orders: No orders of the defined types were placed in this encounter.  No orders of the defined types were placed in this encounter.   Face-to-face time spent with patient was *** minutes. Greater than 50% of time was  spent in counseling and coordination of care.  Follow-Up Instructions: No follow-ups on file.   Earnestine Mealing, CMA  Note - This record has been created using Editor, commissioning.  Chart creation errors have been sought, but may not always  have been located. Such creation errors do not reflect on  the standard of medical care.

## 2019-04-25 ENCOUNTER — Ambulatory Visit: Payer: Self-pay | Admitting: Physician Assistant

## 2019-04-25 NOTE — Progress Notes (Signed)
Office Visit Note  Patient: Stacey Turner             Date of Birth: Mar 28, 1961           MRN: [id number]             PCP: [name], MD Referring: [name], MD Visit Date: 05/09/2019 Occupation: @GUAROCC @  Subjective:  Medication monitoring   History of Present Illness: Stacey Turner is a 58 y.o. female with history of seronegative rheumatoid arthritis.  She is taking Plaquenil 200 mg 1 tablet by mouth twice daily M-F.  She has not had any recent flares.  She has no joint pain or joint swelling.  She has no morning stiffness.  She had her PLQ eye exam on 05/02/19.  She has no concerns at this time.   Activities of Daily Living:  Patient reports morning stiffness for 0  minutes.   Patient Denies nocturnal pain.  Difficulty dressing/grooming: Denies Difficulty climbing stairs: Denies Difficulty getting out of chair: Denies Difficulty using hands for taps, buttons, cutlery, and/or writing: Denies  Review of Systems  Constitutional: Negative for fatigue.  HENT: Negative for mouth sores, mouth dryness and nose dryness.   Eyes: Negative for pain, visual disturbance and dryness.  Respiratory: Negative for cough, hemoptysis, shortness of breath and difficulty breathing.   Cardiovascular: Negative for chest pain, palpitations, hypertension and swelling in legs/feet.  Gastrointestinal: Negative for blood in stool, constipation and diarrhea.  Endocrine: Negative for increased urination.  Genitourinary: Negative for painful urination.  Musculoskeletal: Negative for arthralgias, joint pain, joint swelling, myalgias, muscle weakness, morning stiffness, muscle tenderness and myalgias.  Skin: Negative for color change, pallor, rash, hair loss, nodules/bumps, skin tightness, ulcers and sensitivity to sunlight.  Allergic/Immunologic: Negative for susceptible to infections.  Neurological: Negative for dizziness, numbness, headaches and weakness.  Hematological: Negative for swollen  glands.  Psychiatric/Behavioral: Negative for depressed mood and sleep disturbance. The patient is not nervous/anxious.     PMFS History:  Patient Active Problem List   Diagnosis Date Noted  . Primary insomnia 01/16/2017  . Rheumatoid arthritis of multiple sites with negative rheumatoid factor ([place]) 01/05/2017  . High risk medication use 01/05/2017  . Trochanteric bursitis of both hips 01/05/2017  . Palpitations 10/06/2013  . Cardiac murmur 10/06/2013  . Essential hypertension 10/06/2013  . Hyperlipidemia 10/06/2013  . S/P myomectomy 03/14/2012  . Obesity 01/19/2012  . Fibroids 01/19/2012  . Pelvic pain 01/19/2012    Past Medical History:  Diagnosis Date  . Cardiac murmur   . Fibroid uterus 2001  . H/O abdominal pain 04/24/2011  . H/O dysmenorrhea 2001  . H/O hemorrhoids 2006  . H/O: menorrhagia 2001  . Headache(784.0)   . High cholesterol 2003  . 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 Hypertension   . Irregular periods/menstrual cycles 1999  . Monilial vaginitis 2003  . Palpitations   . Pelvic pain in female 11/25/2011  . PONV (postoperative nausea and vomiting)   . Sinusitis 2009  . Stress 2004  . Yeast vaginitis 2004    Family History  Problem Relation Age of Onset  . Heart failure Mother   . Cancer Father        Colon  . Breast cancer Neg Hx    Past Surgical History:  Procedure Laterality Date  . BREAST BIOPSY Left 2016  . BUNIONECTOMY    . GANGLION CYST EXCISION    . MYOMECTOMY  12/09/2011   Procedure: MYOMECTOMY;  Surgeon: Ena Dawley, MD;  Location: [facility];  Service: Gynecology;  Laterality: N/A;  Abdominal Myomectomy, Biopsy abdominal Myometrium   Social History   Social History Narrative  . Not on file    There is no immunization history on file for this patient.   Objective: Vital Signs: BP 121/72 (BP Location: Left Arm, Patient Position: Sitting, Cuff Size: Small)   Pulse 73   Resp 12   Ht 5\' 2"  (1.575 m)   Wt 197 lb 9.6 oz (89.6 kg)   LMP  12/27/2013 Comment: perimenopausa;  BMI 36.14 kg/m    Physical Exam Vitals signs and nursing note reviewed.  Constitutional:      Appearance: She is well-developed.  HENT:     Head: Normocephalic and atraumatic.  Eyes:     Conjunctiva/sclera: Conjunctivae normal.  Neck:     Musculoskeletal: Normal range of motion.  Cardiovascular:     Rate and Rhythm: Normal rate and regular rhythm.     Heart sounds: Normal heart sounds.  Pulmonary:     Effort: Pulmonary effort is normal.     Breath sounds: Normal breath sounds.  Abdominal:     General: Bowel sounds are normal.     Palpations: Abdomen is soft.  Lymphadenopathy:     Cervical: No cervical adenopathy.  Skin:    General: Skin is warm and dry.     Capillary Refill: Capillary refill takes less than 2 seconds.  Neurological:     Mental Status: She is alert and oriented to person, place, and time.  Psychiatric:        Behavior: Behavior normal.      Musculoskeletal Exam: C-spine, thoracic spine, and lumbar spine good ROM.  No midline spinal tenderness.  No SI joint tenderness.  Shoulder joints, elbow joints, wrist joints, MCPs, PIPs, and DIPs good ROM with no synovitis. Complete fist formation bilaterally.  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  Hip joints, knee joints, ankle joints, MTPs, PIPs, and DIPs good ROM with no synovitis.  No warmth or effusion of knee joints.  No tenderness or swelling of ankle joints.    CDAI Exam: CDAI Score: 0  Patient Global: 0 mm; Provider Global: 0 mm Swollen: 0 ; Tender: 0  Joint Exam   No joint exam has been documented for this visit   There is currently no information documented on the homunculus. Go to the Rheumatology activity and complete the homunculus joint exam.  Investigation: No additional findings.  Imaging: No results found.  Recent Labs: Lab Results  Component Value Date   WBC 4.0 11/22/2018   HGB 12.3 11/22/2018   PLT 217 11/22/2018   NA 142  11/22/2018   K 3.8 11/22/2018   CL 106 11/22/2018   CO2 27 11/22/2018   GLUCOSE 81 11/22/2018   BUN 12 11/22/2018   CREATININE 0.86 11/22/2018   BILITOT 0.5 11/22/2018   ALKPHOS 49 01/19/2017   AST 15 11/22/2018   ALT 14 11/22/2018   PROT 7.2 11/22/2018   ALBUMIN 4.2 01/19/2017   CALCIUM 9.6 11/22/2018   GFRAA 88 11/22/2018    Speciality Comments: PLQ Eye Exam: 05/02/19 WNL [facility] Follow up in 1 year  Procedures:  No procedures performed Allergies: Latex and Penicillins   Assessment / Plan:     Visit Diagnoses: Rheumatoid arthritis of multiple sites with negative rheumatoid factor ([place]) - She has no synovitis on exam.  She has not had any recent rheumatoid arthritis flares.  She is clinically doing well on Plaquenil  200 mg 1 tablet by mouth twice daily Monday through Friday.  She has not missed any doses of Plaquenil recently.  She has no joint pain or joint swelling at this time.  She has no morning stiffness.  She will continue on the current treatment regimen.  She does not need any refills at this time.  We will obtain updated lab work today.  She was advised to notify us if she develops increased joint pain or joint swelling.  She will follow-up in the office in 5 months.  High risk medication use - PLQ 200 mg BID M-F. PLQ Eye Exam: 05/02/19 WNL [facility] Follow up in 1 year.  CBC and CMP will be drawn today to monitor for drug toxicity.- Plan: CBC with Differential/Platelet, COMPLETE METABOLIC PANEL WITH GFR  Primary insomnia -She has been sleeping well recently.    Other fatigue - She has not had any fatigue recently.   Trochanteric bursitis of both hips - Resolved.  She has no tenderness at this time.   Vitamin D deficiency - Vitamin D level will be checked today. Plan: DG BONE DENSITY (DXA), VITAMIN D 25 Hydroxy (Vit-D Deficiency, Fractures)  History of hypertension: BP was 121/72 today in the office.   Mixed hyperlipidemia - She is taking  Pravastatin as prescribed.   Association of heart disease with rheumatoid arthritis was discussed. Need to monitor blood pressure, cholesterol, and to exercise 30-60 minutes on daily basis was discussed. Poor dental hygiene can be a predisposing factor for rheumatoid arthritis. Good dental hygiene was discussed.  Screening for osteoporosis -She is postmenopausal and has not had a DEXA yet.  A future order for DEXA was placed today.  She does have a history of vitamin D deficiency.  We will check a vitamin D level.  Plan: DG BONE DENSITY (DXA), VITAMIN D 25 Hydroxy (Vit-D Deficiency, Fractures)  Orders: Orders Placed This Encounter  Procedures  . DG BONE DENSITY (DXA)  . CBC with Differential/Platelet  . COMPLETE METABOLIC PANEL WITH GFR  . VITAMIN D 25 Hydroxy (Vit-D Deficiency, Fractures)   No orders of the defined types were placed in this encounter.     Follow-Up Instructions: Return in about 5 months (around 10/09/2019) for Rheumatoid arthritis.   Ofilia Neas, PA-C  Note - This record has been created using Dragon software.  Chart creation errors have been sought, but may not always  have been located. Such creation errors do not reflect on  the standard of medical care.

## 2019-05-02 DIAGNOSIS — Z79899 Other long term (current) drug therapy: Secondary | ICD-10-CM | POA: Diagnosis not present

## 2019-05-02 DIAGNOSIS — M0570 Rheumatoid arthritis with rheumatoid factor of unspecified site without organ or systems involvement: Secondary | ICD-10-CM | POA: Diagnosis not present

## 2019-05-02 DIAGNOSIS — H25813 Combined forms of age-related cataract, bilateral: Secondary | ICD-10-CM | POA: Diagnosis not present

## 2019-05-09 ENCOUNTER — Encounter: Payer: Self-pay | Admitting: Physician Assistant

## 2019-05-09 ENCOUNTER — Other Ambulatory Visit: Payer: Self-pay

## 2019-05-09 ENCOUNTER — Ambulatory Visit (INDEPENDENT_AMBULATORY_CARE_PROVIDER_SITE_OTHER): Payer: BC Managed Care – PPO | Admitting: Physician Assistant

## 2019-05-09 VITALS — BP 121/72 | HR 73 | Resp 12 | Ht 62.0 in | Wt 197.6 lb

## 2019-05-09 DIAGNOSIS — Z79899 Other long term (current) drug therapy: Secondary | ICD-10-CM

## 2019-05-09 DIAGNOSIS — Z8679 Personal history of other diseases of the circulatory system: Secondary | ICD-10-CM

## 2019-05-09 DIAGNOSIS — R5383 Other fatigue: Secondary | ICD-10-CM

## 2019-05-09 DIAGNOSIS — E559 Vitamin D deficiency, unspecified: Secondary | ICD-10-CM

## 2019-05-09 DIAGNOSIS — F5101 Primary insomnia: Secondary | ICD-10-CM

## 2019-05-09 DIAGNOSIS — M7062 Trochanteric bursitis, left hip: Secondary | ICD-10-CM

## 2019-05-09 DIAGNOSIS — E782 Mixed hyperlipidemia: Secondary | ICD-10-CM

## 2019-05-09 DIAGNOSIS — M0609 Rheumatoid arthritis without rheumatoid factor, multiple sites: Secondary | ICD-10-CM | POA: Diagnosis not present

## 2019-05-09 DIAGNOSIS — M7061 Trochanteric bursitis, right hip: Secondary | ICD-10-CM

## 2019-05-09 DIAGNOSIS — Z13820 Encounter for screening for osteoporosis: Secondary | ICD-10-CM

## 2019-05-10 LAB — COMPLETE METABOLIC PANEL WITH GFR
AG Ratio: 1.6 (calc) (ref 1.0–2.5)
ALT: 19 U/L (ref 6–29)
AST: 17 U/L (ref 10–35)
Albumin: 4.2 g/dL (ref 3.6–5.1)
Alkaline phosphatase (APISO): 50 U/L (ref 37–153)
BUN: 11 mg/dL (ref 7–25)
CO2: 26 mmol/L (ref 20–32)
Calcium: 9.4 mg/dL (ref 8.6–10.4)
Chloride: 108 mmol/L (ref 98–110)
Creat: 0.69 mg/dL (ref 0.50–1.05)
GFR, Est African American: 114 mL/min/{1.73_m2} (ref >=60)
GFR, Est Non African American: 98 mL/min/{1.73_m2} (ref >=60)
Globulin: 2.6 g/dL (calc) (ref 1.9–3.7)
Glucose, Bld: 82 mg/dL (ref 65–99)
Potassium: 3.9 mmol/L (ref 3.5–5.3)
Sodium: 143 mmol/L (ref 135–146)
Total Bilirubin: 0.4 mg/dL (ref 0.2–1.2)
Total Protein: 6.8 g/dL (ref 6.1–8.1)

## 2019-05-10 LAB — CBC WITH DIFFERENTIAL/PLATELET
Absolute Monocytes: 343 cells/uL (ref 200–950)
Basophils Absolute: 51 cells/uL (ref 0–200)
Basophils Relative: 1.3 %
Eosinophils Absolute: 164 cells/uL (ref 15–500)
Eosinophils Relative: 4.2 %
HCT: 35.9 % (ref 35.0–45.0)
Hemoglobin: 11.6 g/dL — ABNORMAL LOW (ref 11.7–15.5)
Lymphs Abs: 1490 cells/uL (ref 850–3900)
MCH: 26.7 pg — ABNORMAL LOW (ref 27.0–33.0)
MCHC: 32.3 g/dL (ref 32.0–36.0)
MCV: 82.5 fL (ref 80.0–100.0)
MPV: 11.7 fL (ref 7.5–12.5)
Monocytes Relative: 8.8 %
Neutro Abs: 1853 cells/uL (ref 1500–7800)
Neutrophils Relative %: 47.5 %
Platelets: 213 10*3/uL (ref 140–400)
RBC: 4.35 10*6/uL (ref 3.80–5.10)
RDW: 13.1 % (ref 11.0–15.0)
Total Lymphocyte: 38.2 %
WBC: 3.9 10*3/uL (ref 3.8–10.8)

## 2019-05-10 LAB — VITAMIN D 25 HYDROXY (VIT D DEFICIENCY, FRACTURES): Vit D, 25-Hydroxy: 22 ng/mL — ABNORMAL LOW (ref 30–100)

## 2019-05-10 NOTE — Progress Notes (Signed)
Hgb is borderline low.  We will continue to monitor. CMP WNL.  Vitamin D is low.  Please notify patient and send in vitamin D 50,000 units by mouth once weekly for 3 months.  We will recheck vitamin D in 3 months.

## 2019-05-11 ENCOUNTER — Telehealth: Payer: Self-pay | Admitting: *Deleted

## 2019-05-11 MED ORDER — VITAMIN D (ERGOCALCIFEROL) 1.25 MG (50000 UNIT) PO CAPS: 50000.0000 [IU] | ORAL_CAPSULE | ORAL | 0 refills | Status: DC

## 2019-05-11 NOTE — Telephone Encounter (Signed)
Prescription sent to the pharmacy.

## 2019-05-11 NOTE — Telephone Encounter (Signed)
-----   Message from Ofilia Neas, PA-C sent at 05/10/2019  8:09 AM EDT ----- Hgb is borderline low.  We will continue to monitor. CMP WNL.  Vitamin D is low.  Please notify patient and send in vitamin D 50,000 units by mouth once weekly for 3 months.  We will recheck vitamin D in 3 months.

## 2019-06-28 ENCOUNTER — Telehealth: Payer: Self-pay | Admitting: Rheumatology

## 2019-06-28 ENCOUNTER — Other Ambulatory Visit: Payer: Self-pay | Admitting: Rheumatology

## 2019-06-28 DIAGNOSIS — M0609 Rheumatoid arthritis without rheumatoid factor, multiple sites: Secondary | ICD-10-CM

## 2019-06-28 NOTE — Telephone Encounter (Signed)
Patient advised prescription has been sent to [facility].

## 2019-06-28 NOTE — Telephone Encounter (Signed)
Patient called requesting prescription refill of Plaquenil to be sent to [facility].

## 2019-06-28 NOTE — Telephone Encounter (Signed)
Last Visit: 05/09/19 Next Visit: 10/18/19 Labs: 05/09/19 Hgb is borderline low. CMP WNL.  Eye exam: 05/02/19 WNL  Okay to refill per Dr. Estanislado Pandy

## 2019-07-11 DIAGNOSIS — Z23 Encounter for immunization: Secondary | ICD-10-CM | POA: Diagnosis not present

## 2019-08-30 ENCOUNTER — Other Ambulatory Visit: Payer: Self-pay | Admitting: Family Medicine

## 2019-08-30 DIAGNOSIS — Z1231 Encounter for screening mammogram for malignant neoplasm of breast: Secondary | ICD-10-CM

## 2019-09-04 ENCOUNTER — Ambulatory Visit
Admission: RE | Admit: 2019-09-04 | Discharge: 2019-09-04 | Disposition: A | Discharge status: Home / Self Care | Payer: BC Managed Care – PPO | Source: Ambulatory Visit | Attending: Family Medicine | Admitting: Family Medicine

## 2019-09-04 ENCOUNTER — Other Ambulatory Visit: Payer: Self-pay

## 2019-09-04 DIAGNOSIS — Z1231 Encounter for screening mammogram for malignant neoplasm of breast: Secondary | ICD-10-CM

## 2019-09-19 ENCOUNTER — Encounter: Payer: Self-pay | Admitting: Physician Assistant

## 2019-09-19 DIAGNOSIS — M069 Rheumatoid arthritis, unspecified: Secondary | ICD-10-CM | POA: Diagnosis not present

## 2019-09-19 DIAGNOSIS — Z780 Asymptomatic menopausal state: Secondary | ICD-10-CM | POA: Diagnosis not present

## 2019-09-19 DIAGNOSIS — E2839 Other primary ovarian failure: Secondary | ICD-10-CM | POA: Diagnosis not present

## 2019-09-20 ENCOUNTER — Other Ambulatory Visit: Payer: Self-pay | Admitting: Rheumatology

## 2019-09-20 ENCOUNTER — Telehealth: Payer: Self-pay | Admitting: Rheumatology

## 2019-09-20 NOTE — Telephone Encounter (Signed)
Attempted to contact patient and left message on machine to advise patient vitamin d needs to be re-checked prior to refilling the prescription. Advised patient to go to [facility] or [facility] and to call with preferred location so order can be released.

## 2019-09-20 NOTE — Telephone Encounter (Signed)
Patient left a voicemail stating she has questions regarding her Vitamin D.  Patient is requesting a return call.

## 2019-09-28 ENCOUNTER — Telehealth: Payer: Self-pay | Admitting: *Deleted

## 2019-09-28 NOTE — Telephone Encounter (Signed)
Received DEXA results from [place]  Date of Scan: 09/19/2019 Lowest T-score and site measured: -0.9 Lumbar Spine  Significant changes in BMD and site measured (5% and above): N/A  Current Regimen: Calcium and Vitamin D   Recommendation: Repeat Dexa in 5 years

## 2019-10-02 ENCOUNTER — Telehealth: Payer: Self-pay | Admitting: Rheumatology

## 2019-10-02 DIAGNOSIS — Z79899 Other long term (current) drug therapy: Secondary | ICD-10-CM

## 2019-10-02 DIAGNOSIS — E559 Vitamin D deficiency, unspecified: Secondary | ICD-10-CM

## 2019-10-02 NOTE — Telephone Encounter (Signed)
Patient called requesting her labwork orders including Vitamin D be sent to her PCP Dr. Dema Severin.  Patient states she has appointment on Thursday, 1/14 at 8:30 am.

## 2019-10-02 NOTE — Telephone Encounter (Signed)
Lab Orders released and faxed.  °

## 2019-10-04 ENCOUNTER — Telehealth: Payer: Self-pay | Admitting: Rheumatology

## 2019-10-04 DIAGNOSIS — M0609 Rheumatoid arthritis without rheumatoid factor, multiple sites: Secondary | ICD-10-CM

## 2019-10-04 MED ORDER — HYDROXYCHLOROQUINE SULFATE 200 MG PO TABS: ORAL_TABLET | ORAL | 0 refills | Status: DC

## 2019-10-04 NOTE — Telephone Encounter (Signed)
Patient called requesting prescription refill of Plaquenil to be sent to [facility].

## 2019-10-04 NOTE — Telephone Encounter (Signed)
Last Visit: 05/09/2019 Next Visit: 10/17/2018 Labs: 05/09/2019 Hgb is borderline low. We will continue to monitor. CMP WNL.  Eye exam: 05/02/2019  Okay to refill per Dr. Estanislado Pandy.

## 2019-10-05 ENCOUNTER — Encounter: Payer: Self-pay | Admitting: Physician Assistant

## 2019-10-05 DIAGNOSIS — J3089 Other allergic rhinitis: Secondary | ICD-10-CM | POA: Diagnosis not present

## 2019-10-05 DIAGNOSIS — E559 Vitamin D deficiency, unspecified: Secondary | ICD-10-CM | POA: Diagnosis not present

## 2019-10-05 DIAGNOSIS — E785 Hyperlipidemia, unspecified: Secondary | ICD-10-CM | POA: Diagnosis not present

## 2019-10-05 DIAGNOSIS — M0600 Rheumatoid arthritis without rheumatoid factor, unspecified site: Secondary | ICD-10-CM | POA: Diagnosis not present

## 2019-10-05 DIAGNOSIS — I10 Essential (primary) hypertension: Secondary | ICD-10-CM | POA: Diagnosis not present

## 2019-10-11 NOTE — Telephone Encounter (Signed)
Left message to advise patient bone density is normal and recommendation is to repeat in 5 years.

## 2019-10-12 ENCOUNTER — Telehealth: Payer: Self-pay | Admitting: *Deleted

## 2019-10-12 NOTE — Telephone Encounter (Signed)
Labs received from [place] [facility], results reviewed by Dr. Bo Merino Labs drawn on 10/05/2019, all labs within normal limits except the following:  Chloride 108

## 2019-10-13 NOTE — Progress Notes (Signed)
Virtual Visit via Telephone Note  I connected with Stacey Turner on 10/18/19 at 10:15 AM EST by telephone and verified that I am speaking with the correct person using two identifiers.  Location: Patient: Home Provider: Clinic  This service was conducted via virtual visit.   The patient was located at home. I was located in my office.  Consent was obtained prior to the virtual visit and is aware of possible charges through their insurance for this visit.  The patient is an established patient.  Dr. Estanislado Pandy, MD conducted the virtual visit and Hazel Sams, PA-C acted as scribe during the service.  Office staff helped with scheduling follow up visits after the service was conducted.     I discussed the limitations, risks, security and privacy concerns of performing an evaluation and management service by telephone and the availability of in person appointments. I also discussed with the patient that there may be a patient responsible charge related to this service. The patient expressed understanding and agreed to proceed.  CC: Medication monitoring  History of Present Illness: Stacey Turner is a 59 y.o. female with history of seronegative rheumatoid arthritis.  She is taking Plaquenil 200 mg 1 tablet by mouth twice daily M-F.  She denies any recent rheumatoid arthritis flares. She denies any joint pain or joint swelling at this time.  She denies any morning stiffness. She has not had any nocturnal pain.    Review of Systems  Constitutional: Negative for fever and malaise/fatigue.  HENT: Negative for ear pain.   Eyes: Negative for photophobia, pain, discharge and redness.  Respiratory: Negative for cough, shortness of breath and wheezing.   Cardiovascular: Negative for chest pain and palpitations.  Gastrointestinal: Negative for blood in stool, constipation and diarrhea.  Genitourinary: Negative for dysuria and urgency.  Musculoskeletal: Negative for back pain, joint pain, myalgias and neck  pain.  Skin: Negative for rash.  Neurological: Negative for dizziness, weakness and headaches.  Psychiatric/Behavioral: Negative for depression. The patient is not nervous/anxious and does not have insomnia.      Observations/Objective:  Physical Exam  Constitutional: She is oriented to person, place, and time.  Neurological: She is alert and oriented to person, place, and time.  Psychiatric: Mood, memory, affect and judgment normal.   Patient reports morning stiffness for 0 minutes.   Patient denies nocturnal pain.  Difficulty dressing/grooming: Denies Difficulty climbing stairs: Denies Difficulty getting out of chair: Denies Difficulty using hands for taps, buttons, cutlery, and/or writing: Denies  Assessment and Plan: Visit Diagnoses: Rheumatoid arthritis of multiple sites with negative rheumatoid factor ([place]) - She has not had any recent rheumatoid arthritis flares.  She has no joint pain or inflammation at this time. She has not had any morning stiffness or nocturnal pain.  She has not had any difficulty with ADLs.  She is clinically doing well on Plaquenil 200 mg 1 tablet by mouth twice daily M-F.  She will continue on this current regimen.  She was advised to notify us if she develops increased joint pain or joint swelling.  We will obtain updated x-rays of both hands and both feet at her follow up visit. She will follow up in 3-4 months.    High risk medication use - PLQ 200 mg BID M-F. PLQ Eye Exam: 05/02/19 WNL [facility] Follow up in 1 year.  She had updated lab work at Dr. Orest Dikes office on 10/02/19 and will have the results sent to [place].    Primary  insomnia: She has been sleeping well at night overall. She has no nocturnal pain at this time.  Other fatigue: Stable.    Trochanteric bursitis of both hips - She has occasional discomfort. She has been performing stretching exercises daily.    Vitamin D deficiency: She had vitamin D level checked on 10/02/19 and will  have the results sent to [place].  She has been taking a vitamin D supplement.   Other medical conditions are listed as follows:   History of hypertension  Mixed hyperlipidemia - She is taking Pravastatin as prescribed.   Screening for osteoporosis    Follow Up Instructions: She will follow up in 3-4 months.    I discussed the assessment and treatment plan with the patient. The patient was provided an opportunity to ask questions and all were answered. The patient agreed with the plan and demonstrated an understanding of the instructions.   The patient was advised to call back or seek an in-person evaluation if the symptoms worsen or if the condition fails to improve as anticipated.  I provided 15 minutes of non-face-to-face time during this encounter.  Bo Merino, MD   Scribed byLovena Le Dale,PA-C

## 2019-10-18 ENCOUNTER — Encounter: Payer: Self-pay | Admitting: Rheumatology

## 2019-10-18 ENCOUNTER — Other Ambulatory Visit: Payer: Self-pay

## 2019-10-18 ENCOUNTER — Telehealth (INDEPENDENT_AMBULATORY_CARE_PROVIDER_SITE_OTHER): Payer: BC Managed Care – PPO | Admitting: Rheumatology

## 2019-10-18 DIAGNOSIS — F5101 Primary insomnia: Secondary | ICD-10-CM

## 2019-10-18 DIAGNOSIS — E559 Vitamin D deficiency, unspecified: Secondary | ICD-10-CM

## 2019-10-18 DIAGNOSIS — M7061 Trochanteric bursitis, right hip: Secondary | ICD-10-CM

## 2019-10-18 DIAGNOSIS — R5383 Other fatigue: Secondary | ICD-10-CM

## 2019-10-18 DIAGNOSIS — Z8679 Personal history of other diseases of the circulatory system: Secondary | ICD-10-CM

## 2019-10-18 DIAGNOSIS — Z79899 Other long term (current) drug therapy: Secondary | ICD-10-CM | POA: Diagnosis not present

## 2019-10-18 DIAGNOSIS — Z13820 Encounter for screening for osteoporosis: Secondary | ICD-10-CM

## 2019-10-18 DIAGNOSIS — M0609 Rheumatoid arthritis without rheumatoid factor, multiple sites: Secondary | ICD-10-CM

## 2019-10-18 DIAGNOSIS — E782 Mixed hyperlipidemia: Secondary | ICD-10-CM

## 2019-10-18 DIAGNOSIS — M7062 Trochanteric bursitis, left hip: Secondary | ICD-10-CM

## 2019-10-19 ENCOUNTER — Telehealth: Payer: Self-pay | Admitting: Rheumatology

## 2019-10-19 DIAGNOSIS — E559 Vitamin D deficiency, unspecified: Secondary | ICD-10-CM

## 2019-10-19 NOTE — Telephone Encounter (Signed)
Patient states [facility], Dr. Orest Dikes office was to send over her lab results. Patient wanted to check to see if we received those. Dr. Estanislado Pandy was to look at those, and advise her if she was to start on Vit. D again. Please call to advise.

## 2019-10-20 NOTE — Telephone Encounter (Signed)
Called PCP and had labs results faxed. Will review with Dr. Estanislado Pandy

## 2019-10-20 NOTE — Telephone Encounter (Signed)
Patients Vitamin D level on 10/05/19 was 23.5 and was drawn by the PCP at [facility].  Patient would like to know if she should resume Vitamin D supplements. Please advise.

## 2019-10-23 MED ORDER — VITAMIN D (ERGOCALCIFEROL) 1.25 MG (50000 UNIT) PO CAPS: 50000.0000 [IU] | ORAL_CAPSULE | ORAL | 0 refills | Status: DC

## 2019-10-23 NOTE — Telephone Encounter (Signed)
Patient advised Dr. Estanislado Pandy and Hazel Sams, PA-C would like for her to start taking a Vitamin D supplement. Prescription sent in for Vitamin D 50,000 units po weekly. Patient advised we will recheck in 3 months. Order placed.

## 2019-10-23 NOTE — Addendum Note (Signed)
Addended by: Carole Binning on: 10/23/2019 09:06 AM   Modules accepted: Orders

## 2019-10-23 NOTE — Telephone Encounter (Signed)
Please send in vitamin D 50,000 units by mouth once weekly for 3 months.  We will recheck vitamin D in 3 months.

## 2019-12-13 ENCOUNTER — Telehealth: Payer: Self-pay | Admitting: *Deleted

## 2019-12-13 NOTE — Telephone Encounter (Signed)
Received lab results from [facility]CP [facility] on 10/15/19 Reviewed by Hazel Sams, PA-C and Dr. Estanislado Pandy  WBC 3.7 Hgb 11.7 Hct  35.0  Chloride 108 Vitamin D 23.5  Patient is on PLQ 1 tablet by mouth twice daily Mon.-Fri.   Previous WBC 11/22/18 4.0 and 05/09/19 3.9 trending down.  Recommendations: Please advise patient to return for lab work every 3 months. We will continue to monitor WBC closely.   Attempted to contact the patient and left message for patient to call the office.

## 2020-01-23 ENCOUNTER — Other Ambulatory Visit: Payer: Self-pay | Admitting: Rheumatology

## 2020-01-23 DIAGNOSIS — M0609 Rheumatoid arthritis without rheumatoid factor, multiple sites: Secondary | ICD-10-CM

## 2020-01-23 MED ORDER — HYDROXYCHLOROQUINE SULFATE 200 MG PO TABS: ORAL_TABLET | ORAL | 0 refills | Status: DC

## 2020-01-23 NOTE — Telephone Encounter (Signed)
Okay to refill Plaquenil

## 2020-01-23 NOTE — Telephone Encounter (Signed)
Patient request a refill on Plaquenil sent to [facility] on [address].  (new pharmacy)  Per patient it takes her a month to get medication through [facility].

## 2020-01-23 NOTE — Telephone Encounter (Signed)
Last Visit: 10/18/2019 Next Visit: 02/13/2020 Labs: 10/15/2019 WBC 3.7 Hgb 11.7 Hct  35.0  Chloride 108 .  Please advise patient to return for lab work every 3 months. We will continue to monitor WBC closely. Eye exam: 05/02/2019   Advised patient she is due to update labs, patient would like to have repeat labs on 02/13/2020 at scheduled appointment.   Okay to refill plaquenil?

## 2020-01-31 NOTE — Progress Notes (Signed)
Office Visit Note  Patient: Stacey Turner             Date of Birth: [birth year]           MRN: [id number]             PCP: [name], MD Referring: [name], MD Visit Date: 02/13/2020 Occupation: @GUAROCC @  Subjective:  Medication management   History of Present Illness: Stacey Turner is a 59 y.o. female with history of rheumatoid arthritis.  She states she has been taking Plaquenil on a regular basis.  She denies any increased joint pain or joint swelling.  She states she continues to have insomnia and fatigue.  She naps during the daytime after work.  The trochanteric bursitis still bothers her off and on.  It is not very bothersome currently.  Activities of Daily Living:  Patient reports morning stiffness for 0 none.   Patient Denies nocturnal pain.  Difficulty dressing/grooming: Denies Difficulty climbing stairs: Denies Difficulty getting out of chair: Denies Difficulty using hands for taps, buttons, cutlery, and/or writing: Reports  Review of Systems  Constitutional: Negative for fatigue, night sweats, weight gain and weight loss.  HENT: Negative for mouth sores, trouble swallowing, trouble swallowing, mouth dryness and nose dryness.   Eyes: Negative for pain, redness, visual disturbance and dryness.  Respiratory: Negative for cough, shortness of breath and difficulty breathing.   Cardiovascular: Negative for chest pain, palpitations, hypertension, irregular heartbeat and swelling in legs/feet.  Gastrointestinal: Negative for blood in stool, constipation and diarrhea.  Endocrine: Negative for excessive thirst and increased urination.  Genitourinary: Negative for difficulty urinating and vaginal dryness.  Musculoskeletal: Positive for arthralgias and joint pain. Negative for joint swelling, myalgias, muscle weakness, morning stiffness, muscle tenderness and myalgias.  Skin: Negative for color change, rash, hair loss, skin tightness, ulcers and sensitivity to  sunlight.  Allergic/Immunologic: Negative for susceptible to infections.  Neurological: Negative for dizziness, numbness, memory loss, night sweats and weakness.  Hematological: Negative for bruising/bleeding tendency and swollen glands.  Psychiatric/Behavioral: Positive for sleep disturbance. Negative for depressed mood. The patient is not nervous/anxious.     PMFS History:  Patient Active Problem List   Diagnosis Date Noted  . Primary insomnia 01/16/2017  . Rheumatoid arthritis of multiple sites with negative rheumatoid factor ([place]) 01/05/2017  . High risk medication use 01/05/2017  . Trochanteric bursitis of both hips 01/05/2017  . Palpitations 10/06/2013  . Cardiac murmur 10/06/2013  . Essential hypertension 10/06/2013  . Hyperlipidemia 10/06/2013  . S/P myomectomy 03/14/2012  . Obesity 01/19/2012  . Fibroids 01/19/2012  . Pelvic pain 01/19/2012    Past Medical History:  Diagnosis Date  . Cardiac murmur   . Fibroid uterus 2001  . H/O abdominal pain 04/24/2011  . H/O dysmenorrhea 2001  . H/O hemorrhoids 2006  . H/O: menorrhagia 2001  . Headache(784.0)   . High cholesterol 2003  . 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 Hypertension   . Irregular periods/menstrual cycles 1999  . Monilial vaginitis 2003  . Palpitations   . Pelvic pain in female 11/25/2011  . PONV (postoperative nausea and vomiting)   . Rheumatoid arthritis ([place])   . Sinusitis 2009  . Stress 2004  . Yeast vaginitis 2004    Family History  Problem Relation Age of Onset  . Heart failure Mother   . Cancer Father        Colon  . Breast cancer Neg Hx    Past Surgical History:  Procedure Laterality Date  .  BREAST BIOPSY Left 2016  . BUNIONECTOMY    . GANGLION CYST EXCISION    . MYOMECTOMY  12/09/2011   Procedure: MYOMECTOMY;  Surgeon: Ena Dawley, MD;  Location: [facility];  Service: Gynecology;  Laterality: N/A;  Abdominal Myomectomy, Biopsy abdominal Myometrium   Social History   Social History Narrative  .  Not on file    There is no immunization history on file for this patient.   Objective: Vital Signs: BP 123/65 (BP Location: Left Arm, Patient Position: Sitting, Cuff Size: Normal)   Pulse 74   Resp 16   Ht 5\' 2"  (1.575 m)   Wt 204 lb 3.2 oz (92.6 kg)   LMP 12/27/2013 Comment: perimenopausa;  BMI 37.35 kg/m    Physical Exam Vitals and nursing note reviewed.  Constitutional:      Appearance: She is well-developed.  HENT:     Head: Normocephalic and atraumatic.  Eyes:     Conjunctiva/sclera: Conjunctivae normal.  Cardiovascular:     Rate and Rhythm: Normal rate and regular rhythm.     Heart sounds: Normal heart sounds.  Pulmonary:     Effort: Pulmonary effort is normal.     Breath sounds: Normal breath sounds.  Abdominal:     General: Bowel sounds are normal.     Palpations: Abdomen is soft.  Musculoskeletal:     Cervical back: Normal range of motion.  Lymphadenopathy:     Cervical: No cervical adenopathy.  Skin:    General: Skin is warm and dry.     Capillary Refill: Capillary refill takes less than 2 seconds.  Neurological:     Mental Status: She is alert and oriented to person, place, and time.  Psychiatric:        Behavior: Behavior normal.      Musculoskeletal Exam: C-spine thoracic and lumbar spine with good range of motion.  Shoulder joints, elbow joints, wrist joints, MCPs PIPs and DIPs with good range of motion with no synovitis.  Hip joints, knee joints, ankles, MTPs and PIPs had no synovitis on examination.  CDAI Exam: CDAI Score: 0.5  Patient Global: 3 mm; Provider Global: 2 mm Swollen: 0 ; Tender: 0  Joint Exam 02/13/2020   No joint exam has been documented for this visit   There is currently no information documented on the homunculus. Go to the Rheumatology activity and complete the homunculus joint exam.  Investigation: No additional findings.  Imaging: XR Foot 2 Views Left  Result Date: 02/13/2020 Screw was noted in the first metatarsal.   Narrowing of first MTP joint with postsurgical changes were noted.  PIP and DIP narrowing was noted.  Postsurgical changes in the fifth phalanx was noted.  No intertarsal tibiotalar joint space narrowing was noted.  A small calcaneal spur was noted.  No erosive changes were noted. 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  XR Foot 2 Views Right  Result Date: 02/13/2020 First MTP severe narrowing with possible erosive changes were noted.  PIP and DIP narrowing was noted.  Postsurgical changes noted in the fifth middle phalanx.  No intertarsal or tibiotalar joint space narrowing was noted.  Calcaneal spur was noted. 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  XR Hand 2 View Left  Result Date: 02/13/2020 Juxta-articular osteopenia was noted.  First second and third MCP joint narrowing was noted.  Mild PIP and DIP narrowing was noted.  No significant intercarpal or radiocarpal joint space narrowing was noted.  No erosive changes were noted. 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  XR Hand 2 View Right  Result Date: 02/13/2020 Juxta-articular osteopenia was noted.  First second and third MCP joint narrowing was noted.  Mild PIP and DIP narrowing was noted.  No significant intercarpal or radiocarpal joint space narrowing was noted.  No erosive changes were noted. 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   Recent Labs: Lab Results  Component Value Date   WBC 3.9 05/09/2019   HGB 11.6 (L) 05/09/2019   PLT 213 05/09/2019   NA 143 05/09/2019   K 3.9 05/09/2019   CL 108 05/09/2019   CO2 26 05/09/2019   GLUCOSE 82 05/09/2019   BUN 11 05/09/2019   CREATININE 0.69 05/09/2019   BILITOT 0.4 05/09/2019   ALKPHOS 49 01/19/2017   AST 17 05/09/2019   ALT 19 05/09/2019   PROT 6.8 05/09/2019    ALBUMIN 4.2 01/19/2017   CALCIUM 9.4 05/09/2019   GFRAA 114 05/09/2019    Speciality Comments: PLQ Eye Exam: 05/02/19 WNL [facility] Follow up in 1 year  Procedures:  No procedures performed Allergies: Latex and Penicillins   Assessment / Plan:     Visit Diagnoses: Rheumatoid arthritis of multiple sites with negative rheumatoid factor ([place]) -patient is clinically doing well.  She is on Plaquenil which she is tolerating well.  X-rays obtained today did not show any erosive changes.  Plan: XR Foot 2 Views Right, XR Foot 2 Views Left, XR Hand 2 View Right, XR Hand 2 View Left.  X-ray findings were discussed with the patient at length.  High risk medication use - PLQ 200 mg BID M-F.  Eye examination May 02, 2019 was normal at the [facility]. -  CBC with Differential/Platelet, COMPLETE METABOLIC PANEL WITH GFR today and then every 5 months.  Trochanteric bursitis of both hips-she has intermittent discomfort.  She has been doing stretching exercises.  Vitamin D deficiency -she ran out of her vitamin D.  Have advised her to take some maintenance dose.  We will check vitamin D level today.  Plan: VITAMIN D 25 Hydroxy (Vit-D Deficiency, Fractures)  Primary insomnia-good sleep hygiene was discussed.  She usually naps during the afternoon.  Although she states she is unable to not nap because she gets very tired.  Other fatigue  History of hypertension-blood pressure is controlled.  Mixed hyperlipidemia-she will benefit from weight loss.  Orders: Orders Placed This Encounter  Procedures  . XR Foot 2 Views Right  . XR Foot 2 Views Left  . XR Hand 2 View Right  . XR Hand 2 View Left  . CBC with Differential/Platelet  . COMPLETE METABOLIC PANEL WITH GFR  . VITAMIN D 25 Hydroxy (Vit-D Deficiency, Fractures)   No orders of the defined types were placed in this encounter.     Follow-Up Instructions: Return in about 5 months (around 07/15/2020) for Rheumatoid  arthritis, Osteoarthritis.   Bo Merino, MD  Note - This record has been created using Editor, commissioning.  Chart creation errors have been sought, but may not always  have been located. Such creation errors do not reflect on  the standard of medical care.

## 2020-02-02 ENCOUNTER — Other Ambulatory Visit: Payer: Self-pay | Admitting: Rheumatology

## 2020-02-02 NOTE — Telephone Encounter (Signed)
Left message to advise patient she will need Vitamin D rechecked prior to refill.

## 2020-02-13 ENCOUNTER — Other Ambulatory Visit: Payer: Self-pay

## 2020-02-13 ENCOUNTER — Encounter: Payer: Self-pay | Admitting: Rheumatology

## 2020-02-13 ENCOUNTER — Ambulatory Visit: Payer: BC Managed Care – PPO | Admitting: Rheumatology

## 2020-02-13 ENCOUNTER — Ambulatory Visit: Payer: Self-pay

## 2020-02-13 VITALS — BP 123/65 | HR 74 | Resp 16 | Ht 62.0 in | Wt 204.2 lb

## 2020-02-13 DIAGNOSIS — M7062 Trochanteric bursitis, left hip: Secondary | ICD-10-CM

## 2020-02-13 DIAGNOSIS — E782 Mixed hyperlipidemia: Secondary | ICD-10-CM

## 2020-02-13 DIAGNOSIS — M7061 Trochanteric bursitis, right hip: Secondary | ICD-10-CM | POA: Diagnosis not present

## 2020-02-13 DIAGNOSIS — F5101 Primary insomnia: Secondary | ICD-10-CM

## 2020-02-13 DIAGNOSIS — Z79899 Other long term (current) drug therapy: Secondary | ICD-10-CM | POA: Diagnosis not present

## 2020-02-13 DIAGNOSIS — Z8679 Personal history of other diseases of the circulatory system: Secondary | ICD-10-CM

## 2020-02-13 DIAGNOSIS — M79671 Pain in right foot: Secondary | ICD-10-CM

## 2020-02-13 DIAGNOSIS — M79642 Pain in left hand: Secondary | ICD-10-CM | POA: Diagnosis not present

## 2020-02-13 DIAGNOSIS — M79641 Pain in right hand: Secondary | ICD-10-CM

## 2020-02-13 DIAGNOSIS — R5383 Other fatigue: Secondary | ICD-10-CM

## 2020-02-13 DIAGNOSIS — M0609 Rheumatoid arthritis without rheumatoid factor, multiple sites: Secondary | ICD-10-CM

## 2020-02-13 DIAGNOSIS — E559 Vitamin D deficiency, unspecified: Secondary | ICD-10-CM

## 2020-02-13 DIAGNOSIS — M79672 Pain in left foot: Secondary | ICD-10-CM

## 2020-02-14 LAB — COMPLETE METABOLIC PANEL WITH GFR
AG Ratio: 1.7 (calc) (ref 1.0–2.5)
ALT: 18 U/L (ref 6–29)
AST: 16 U/L (ref 10–35)
Albumin: 4.1 g/dL (ref 3.6–5.1)
Alkaline phosphatase (APISO): 52 U/L (ref 37–153)
BUN: 8 mg/dL (ref 7–25)
CO2: 28 mmol/L (ref 20–32)
Calcium: 9.3 mg/dL (ref 8.6–10.4)
Chloride: 108 mmol/L (ref 98–110)
Creat: 0.71 mg/dL (ref 0.50–1.05)
GFR, Est African American: 110 mL/min/{1.73_m2} (ref >=60)
GFR, Est Non African American: 95 mL/min/{1.73_m2} (ref >=60)
Globulin: 2.4 g/dL (calc) (ref 1.9–3.7)
Glucose, Bld: 83 mg/dL (ref 65–99)
Potassium: 4.7 mmol/L (ref 3.5–5.3)
Sodium: 143 mmol/L (ref 135–146)
Total Bilirubin: 0.5 mg/dL (ref 0.2–1.2)
Total Protein: 6.5 g/dL (ref 6.1–8.1)

## 2020-02-14 LAB — CBC WITH DIFFERENTIAL/PLATELET
Absolute Monocytes: 340 cells/uL (ref 200–950)
Basophils Absolute: 39 cells/uL (ref 0–200)
Basophils Relative: 1.1 %
Eosinophils Absolute: 119 cells/uL (ref 15–500)
Eosinophils Relative: 3.4 %
HCT: 36.7 % (ref 35.0–45.0)
Hemoglobin: 11.9 g/dL (ref 11.7–15.5)
Lymphs Abs: 1243 cells/uL (ref 850–3900)
MCH: 27.2 pg (ref 27.0–33.0)
MCHC: 32.4 g/dL (ref 32.0–36.0)
MCV: 83.8 fL (ref 80.0–100.0)
MPV: 11.9 fL (ref 7.5–12.5)
Monocytes Relative: 9.7 %
Neutro Abs: 1761 cells/uL (ref 1500–7800)
Neutrophils Relative %: 50.3 %
Platelets: 183 10*3/uL (ref 140–400)
RBC: 4.38 10*6/uL (ref 3.80–5.10)
RDW: 13.1 % (ref 11.0–15.0)
Total Lymphocyte: 35.5 %
WBC: 3.5 10*3/uL — ABNORMAL LOW (ref 3.8–10.8)

## 2020-02-14 LAB — VITAMIN D 25 HYDROXY (VIT D DEFICIENCY, FRACTURES): Vit D, 25-Hydroxy: 48 ng/mL (ref 30–100)

## 2020-02-14 NOTE — Progress Notes (Signed)
WBC is mildly decreased. Rest the labs are normal. We will continue to monitor.

## 2020-04-18 DIAGNOSIS — E785 Hyperlipidemia, unspecified: Secondary | ICD-10-CM | POA: Diagnosis not present

## 2020-04-18 DIAGNOSIS — J301 Allergic rhinitis due to pollen: Secondary | ICD-10-CM | POA: Diagnosis not present

## 2020-04-18 DIAGNOSIS — I10 Essential (primary) hypertension: Secondary | ICD-10-CM | POA: Diagnosis not present

## 2020-05-07 ENCOUNTER — Other Ambulatory Visit: Payer: Self-pay | Admitting: Rheumatology

## 2020-05-07 DIAGNOSIS — M0609 Rheumatoid arthritis without rheumatoid factor, multiple sites: Secondary | ICD-10-CM

## 2020-05-07 MED ORDER — HYDROXYCHLOROQUINE SULFATE 200 MG PO TABS: ORAL_TABLET | ORAL | 0 refills | Status: DC

## 2020-05-07 NOTE — Telephone Encounter (Signed)
Patient called requesting to speak with you directly regarding her prescription of Plaquenil.

## 2020-05-07 NOTE — Telephone Encounter (Signed)
Patient states she is not being given her 90 day supply with [facility] and is going to contact [facility] about potentially switching to [facility]. Patient would like a 30 day supply of PLQ sent to [facility].   Last Visit: 02/13/2020 Next Visit: 07/16/2020  Labs: 02/13/2020 WBC is mildly decreased. Rest the labs are normal. We will continue to monitor. Eye exam: 05/02/2019 WNL. (has appointment scheduled to update on 05/28/2020)  Current Dose per office note 02/13/2020:  PLQ 200 mg BID M-F DX: Rheumatoid arthritis of multiple sites with negative rheumatoid factor   Okay to refill Plaquenil?

## 2020-05-08 ENCOUNTER — Other Ambulatory Visit: Payer: Self-pay | Admitting: *Deleted

## 2020-05-08 DIAGNOSIS — M0609 Rheumatoid arthritis without rheumatoid factor, multiple sites: Secondary | ICD-10-CM

## 2020-05-08 MED ORDER — HYDROXYCHLOROQUINE SULFATE 200 MG PO TABS: ORAL_TABLET | ORAL | 0 refills | Status: DC

## 2020-05-08 NOTE — Telephone Encounter (Signed)
Refill request received from [facility]   Last Visit: 02/13/2020 Next Visit: 07/16/2020  Labs: 02/13/2020 WBC is mildly decreased. Rest the labs are normal. We will continue to monitor. Eye exam: 05/02/2019 WNL. (has appointment scheduled to update on 05/28/2020)  Current Dose per office note 02/13/2020: PLQ 200 mg BID M-F [id number] arthritis of multiple sites with negative rheumatoid factor   Okay to refill Plaquenil?

## 2020-05-28 DIAGNOSIS — M0570 Rheumatoid arthritis with rheumatoid factor of unspecified site without organ or systems involvement: Secondary | ICD-10-CM | POA: Diagnosis not present

## 2020-05-28 DIAGNOSIS — Z79899 Other long term (current) drug therapy: Secondary | ICD-10-CM | POA: Diagnosis not present

## 2020-05-28 DIAGNOSIS — H25813 Combined forms of age-related cataract, bilateral: Secondary | ICD-10-CM | POA: Diagnosis not present

## 2020-07-03 NOTE — Progress Notes (Deleted)
Office Visit Note  Patient: Stacey Turner             Date of Birth: 02-28-1961           MRN: [id number]             PCP: [name], MD Referring: [name], MD Visit Date: 07/16/2020 Occupation: @GUAROCC @  Subjective:  No chief complaint on file.   History of Present Illness: Stacey Turner is a 59 y.o. female ***   Activities of Daily Living:  Patient reports morning stiffness for *** {minute/hour:19697}.   Patient {ACTIONS;DENIES/REPORTS:21021675::"Denies"} nocturnal pain.  Difficulty dressing/grooming: {ACTIONS;DENIES/REPORTS:21021675::"Denies"} Difficulty climbing stairs: {ACTIONS;DENIES/REPORTS:21021675::"Denies"} Difficulty getting out of chair: {ACTIONS;DENIES/REPORTS:21021675::"Denies"} Difficulty using hands for taps, buttons, cutlery, and/or writing: {ACTIONS;DENIES/REPORTS:21021675::"Denies"}  No Rheumatology ROS completed.   PMFS History:  Patient Active Problem List   Diagnosis Date Noted  . Primary insomnia 01/16/2017  . Rheumatoid arthritis of multiple sites with negative rheumatoid factor ([place]) 01/05/2017  . High risk medication use 01/05/2017  . Trochanteric bursitis of both hips 01/05/2017  . Palpitations 10/06/2013  . Cardiac murmur 10/06/2013  . Essential hypertension 10/06/2013  . Hyperlipidemia 10/06/2013  . S/P myomectomy 03/14/2012  . Obesity 01/19/2012  . Fibroids 01/19/2012  . Pelvic pain 01/19/2012    Past Medical History:  Diagnosis Date  . Cardiac murmur   . Fibroid uterus 2001  . H/O abdominal pain 04/24/2011  . H/O dysmenorrhea 2001  . H/O hemorrhoids 2006  . H/O: menorrhagia 2001  . Headache(784.0)   . High cholesterol 2003  . 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 Hypertension   . Irregular periods/menstrual cycles 1999  . Monilial vaginitis 2003  . Palpitations   . Pelvic pain in female 11/25/2011  . PONV (postoperative nausea and vomiting)   . Rheumatoid arthritis ([place])   . Sinusitis 2009  . Stress 2004  . Yeast vaginitis  2004    Family History  Problem Relation Age of Onset  . Heart failure Mother   . Cancer Father        Colon  . Breast cancer Neg Hx    Past Surgical History:  Procedure Laterality Date  . BREAST BIOPSY Left 2016  . BUNIONECTOMY    . GANGLION CYST EXCISION    . MYOMECTOMY  12/09/2011   Procedure: MYOMECTOMY;  Surgeon: Ena Dawley, MD;  Location: [facility];  Service: Gynecology;  Laterality: N/A;  Abdominal Myomectomy, Biopsy abdominal Myometrium   Social History   Social History Narrative  . Not on file    There is no immunization history on file for this patient.   Objective: Vital Signs: LMP 12/27/2013 Comment: perimenopausa;   Physical Exam   Musculoskeletal Exam: ***  CDAI Exam: CDAI Score: -- Patient Global: --; Provider Global: -- Swollen: --; Tender: -- Joint Exam 07/16/2020   No joint exam has been documented for this visit   There is currently no information documented on the homunculus. Go to the Rheumatology activity and complete the homunculus joint exam.  Investigation: No additional findings.  Imaging: No results found.  Recent Labs: Lab Results  Component Value Date   WBC 3.5 (L) 02/13/2020   HGB 11.9 02/13/2020   PLT 183 02/13/2020   NA 143 02/13/2020   K 4.7 02/13/2020   CL 108 02/13/2020   CO2 28 02/13/2020   GLUCOSE 83 02/13/2020   BUN 8 02/13/2020   CREATININE 0.71 02/13/2020   BILITOT 0.5 02/13/2020   ALKPHOS 49 01/19/2017   AST 16 02/13/2020  ALT 18 02/13/2020   PROT 6.5 02/13/2020   ALBUMIN 4.2 01/19/2017   CALCIUM 9.3 02/13/2020   GFRAA 110 02/13/2020    Speciality Comments: PLQ Eye Exam: 05/02/19 WNL [facility] Follow up in 1 year  Procedures:  No procedures performed Allergies: Latex and Penicillins   Assessment / Plan:     Visit Diagnoses: No diagnosis found.  Orders: No orders of the defined types were placed in this encounter.  No orders of the defined types were placed in this  encounter.   Face-to-face time spent with patient was *** minutes. Greater than 50% of time was spent in counseling and coordination of care.  Follow-Up Instructions: No follow-ups on file.   Earnestine Mealing, CMA  Note - This record has been created using Editor, commissioning.  Chart creation errors have been sought, but may not always  have been located. Such creation errors do not reflect on  the standard of medical care.

## 2020-07-15 ENCOUNTER — Other Ambulatory Visit: Payer: Self-pay | Admitting: Physician Assistant

## 2020-07-15 DIAGNOSIS — M0609 Rheumatoid arthritis without rheumatoid factor, multiple sites: Secondary | ICD-10-CM

## 2020-07-15 NOTE — Telephone Encounter (Signed)
Last Visit: 02/13/2020 Next Visit: 112/09/2019 Labs: 02/13/2020 WBC is mildly decreased. Rest the labs are normal. We will continue to monitor. Eye exam: 05/02/2019 WNL  Current Dose per office note 02/13/2020: PLQ 200 mg BID M-F [id number] arthritis of multiple sites with negative rheumatoid factor   Left message to advise patient she is due to update her PLQ eye exam and labs.  Okay to refill Plaquenil?

## 2020-07-16 ENCOUNTER — Ambulatory Visit: Payer: BC Managed Care – PPO | Admitting: Rheumatology

## 2020-07-16 DIAGNOSIS — M0609 Rheumatoid arthritis without rheumatoid factor, multiple sites: Secondary | ICD-10-CM

## 2020-07-16 DIAGNOSIS — F5101 Primary insomnia: Secondary | ICD-10-CM

## 2020-07-16 DIAGNOSIS — Z79899 Other long term (current) drug therapy: Secondary | ICD-10-CM

## 2020-07-16 DIAGNOSIS — R5383 Other fatigue: Secondary | ICD-10-CM

## 2020-07-16 DIAGNOSIS — E559 Vitamin D deficiency, unspecified: Secondary | ICD-10-CM

## 2020-07-16 DIAGNOSIS — Z8679 Personal history of other diseases of the circulatory system: Secondary | ICD-10-CM

## 2020-07-16 DIAGNOSIS — E782 Mixed hyperlipidemia: Secondary | ICD-10-CM

## 2020-07-16 DIAGNOSIS — M7062 Trochanteric bursitis, left hip: Secondary | ICD-10-CM

## 2020-08-07 NOTE — Progress Notes (Signed)
Office Visit Note  Patient: Stacey Turner             Date of Birth: 1961/06/05           MRN: [id number]             PCP: [name], MD Referring: [name], MD Visit Date: 08/21/2020 Occupation: @GUAROCC @  Subjective:  Medication Management   History of Present Illness: Stacey Turner is a 59 y.o. female with history of seronegative rheumatoid arthritis.  She states she has been tolerating Plaquenil well.  According the patient she had eye examination in August 2021 for monitoring of ocular toxicity.  She denies any joint swelling.  She has not had any stiffness.  She states her left trochanteric bursitis shoulder painful.  She has been working through home and has to sit for several hours.  She has been taking vitamin D 5000 units once a week for vitamin D deficiency.  Activities of Daily Living:  Patient reports morning stiffness for 0 minutes.   Patient Denies nocturnal pain.  Difficulty dressing/grooming: Denies Difficulty climbing stairs: Denies Difficulty getting out of chair: Denies Difficulty using hands for taps, buttons, cutlery, and/or writing: Denies  Review of Systems  Constitutional: Negative for fatigue.  HENT: Negative for mouth sores, mouth dryness and nose dryness.   Eyes: Positive for dryness. Negative for pain and itching.  Respiratory: Negative for shortness of breath and difficulty breathing.   Cardiovascular: Negative for chest pain and palpitations.  Gastrointestinal: Negative for blood in stool, constipation and diarrhea.  Endocrine: Negative for increased urination.  Genitourinary: Negative for difficulty urinating.  Musculoskeletal: Negative for arthralgias, joint pain, joint swelling, myalgias, morning stiffness, muscle tenderness and myalgias.  Skin: Negative for color change, rash and redness.  Allergic/Immunologic: Negative for susceptible to infections.  Neurological: Negative for dizziness, numbness, headaches, memory loss and  weakness.  Hematological: Negative for bruising/bleeding tendency.  Psychiatric/Behavioral: Positive for sleep disturbance. Negative for confusion.    PMFS History:  Patient Active Problem List   Diagnosis Date Noted  . Primary insomnia 01/16/2017  . Rheumatoid arthritis of multiple sites with negative rheumatoid factor ([place]) 01/05/2017  . High risk medication use 01/05/2017  . Trochanteric bursitis of both hips 01/05/2017  . Palpitations 10/06/2013  . Cardiac murmur 10/06/2013  . Essential hypertension 10/06/2013  . Hyperlipidemia 10/06/2013  . S/P myomectomy 03/14/2012  . Obesity 01/19/2012  . Fibroids 01/19/2012  . Pelvic pain 01/19/2012    Past Medical History:  Diagnosis Date  . Cardiac murmur   . Fibroid uterus 2001  . H/O abdominal pain 04/24/2011  . H/O dysmenorrhea 2001  . H/O hemorrhoids 2006  . H/O: menorrhagia 2001  . Headache(784.0)   . High cholesterol 2003  . 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 Hypertension   . Irregular periods/menstrual cycles 1999  . Monilial vaginitis 2003  . Palpitations   . Pelvic pain in female 11/25/2011  . PONV (postoperative nausea and vomiting)   . Rheumatoid arthritis ([place])   . Sinusitis 2009  . Stress 2004  . Yeast vaginitis 2004    Family History  Problem Relation Age of Onset  . Heart failure Mother   . Cancer Father        Colon  . Breast cancer Neg Hx    Past Surgical History:  Procedure Laterality Date  . BREAST BIOPSY Left 2016  . BUNIONECTOMY    . GANGLION CYST EXCISION    . MYOMECTOMY  12/09/2011   Procedure:  MYOMECTOMY;  Surgeon: Ena Dawley, MD;  Location: [facility];  Service: Gynecology;  Laterality: N/A;  Abdominal Myomectomy, Biopsy abdominal Myometrium   Social History   Social History Narrative  . Not on file   Immunization History  Administered Date(s) Administered  . PFIZER SARS-COV-2 Vaccination 05/16/2020, 06/06/2020     Objective: Vital Signs: BP (!) 145/79 (BP Location: Left Arm, Patient  Position: Sitting, Cuff Size: Normal)   Pulse 81   Resp 15   Ht 5\' 2"  (1.575 m)   Wt 211 lb 3.2 oz (95.8 kg)   LMP 12/27/2013   BMI 38.63 kg/m    Physical Exam Vitals and nursing note reviewed.  Constitutional:      Appearance: She is well-developed.  HENT:     Head: Normocephalic and atraumatic.  Eyes:     Conjunctiva/sclera: Conjunctivae normal.  Cardiovascular:     Rate and Rhythm: Normal rate and regular rhythm.     Heart sounds: Normal heart sounds.  Pulmonary:     Effort: Pulmonary effort is normal.     Breath sounds: Normal breath sounds.  Abdominal:     General: Bowel sounds are normal.     Palpations: Abdomen is soft.  Musculoskeletal:     Cervical back: Normal range of motion.  Lymphadenopathy:     Cervical: No cervical adenopathy.  Skin:    General: Skin is warm and dry.     Capillary Refill: Capillary refill takes less than 2 seconds.  Neurological:     Mental Status: She is alert and oriented to person, place, and time.  Psychiatric:        Behavior: Behavior normal.      Musculoskeletal Exam: C-spine, thoracic and lumbar spine with good range of motion.  Shoulder joints, elbow joints, wrist joints, MCPs PIPs and DIPs with good range of motion with no synovitis.  Hip joints, knee joints, ankles, MTPs and PIPs with good range of motion with no synovitis.  CDAI Exam: CDAI Score: 0.5  Patient Global: 3 mm; Provider Global: 2 mm Swollen: 0 ; Tender: 0  Joint Exam 08/21/2020   No joint exam has been documented for this visit   There is currently no information documented on the homunculus. Go to the Rheumatology activity and complete the homunculus joint exam.  Investigation: No additional findings.  Imaging: No results found.  Recent Labs: Lab Results  Component Value Date   WBC 4.2 08/13/2020   HGB 11.6 (L) 08/13/2020   PLT 212 08/13/2020   NA 143 08/13/2020   K 4.4 08/13/2020   CL 108 08/13/2020   CO2 29 08/13/2020   GLUCOSE 86 08/13/2020    BUN 11 08/13/2020   CREATININE 0.66 08/13/2020   BILITOT 0.4 08/13/2020   ALKPHOS 49 01/19/2017   AST 14 08/13/2020   ALT 19 08/13/2020   PROT 6.8 08/13/2020   ALBUMIN 4.2 01/19/2017   CALCIUM 9.6 08/13/2020   GFRAA 114 08/13/2020    Speciality Comments: PLQ Eye Exam: 04/2020 WNL [facility] Follow up in 1 year  Procedures:  No procedures performed Allergies: Latex and Penicillins   Assessment / Plan:     Visit Diagnoses: Rheumatoid arthritis of multiple sites with negative rheumatoid factor (HCC)-patient is doing well and has not had any flares.  She states she has occasional discomfort in her joints but no swelling.  She denies any morning stiffness.  She has been tolerating Plaquenil well.  High risk medication use - PLQ 200 mg BID M-F. PLQ  Eye Exam: August 2021 per patient.  We have not received the eye examination form from the ophthalmologist.  Trochanteric bursitis of both hips-she continues to have some discomfort in her left trochanteric bursa.  She has a handout on the trochanteric bursa exercises.  Need for stretching exercise was emphasized.  Vitamin D deficiency-she has been taking vitamin D 5000 units once a week.  Have advised her to take total 10,000 units a week or she can switch to vitamin D 2000 units daily.  We will check vitamin D level with next blood work.  Her vitamin D Raynauds 22 follow-up in 03/09/2019 but improved after 3 months of vitamin D 50,000 units weekly to the level of 48.  Other fatigue-related to working at home and insomnia.  Primary insomnia-sleep hygiene was discussed.  Mixed hyperlipidemia-dietary modifications were discussed.  History of hypertension-blood pressure medication was changed.  Blood pressure was still slightly elevated.  Have advised her to monitor blood pressure closely.  Educated about COVID-19 virus infection-patient has been fully vaccinated against COVID-19.  She will need a booster 6 months after the second  dose.  Use of mask, social distancing and hand hygiene was emphasized.  I also discussed use of monoclonal antibody infusion in case she develops COVID-19 infection.  Instructions were placed in the AVS per ACR guidelines.  Orders: Orders Placed This Encounter  Procedures  . CBC with Differential/Platelet  . COMPLETE METABOLIC PANEL WITH GFR   No orders of the defined types were placed in this encounter.    Follow-Up Instructions: Return in about 5 months (around 01/19/2021) for Rheumatoid arthritis.   Bo Merino, MD  Note - This record has been created using Editor, commissioning.  Chart creation errors have been sought, but may not always  have been located. Such creation errors do not reflect on  the standard of medical care.

## 2020-08-13 ENCOUNTER — Other Ambulatory Visit: Payer: Self-pay | Admitting: *Deleted

## 2020-08-13 DIAGNOSIS — Z79899 Other long term (current) drug therapy: Secondary | ICD-10-CM

## 2020-08-13 LAB — COMPLETE METABOLIC PANEL WITH GFR
AG Ratio: 1.6 (calc) (ref 1.0–2.5)
ALT: 19 U/L (ref 6–29)
AST: 14 U/L (ref 10–35)
Albumin: 4.2 g/dL (ref 3.6–5.1)
Alkaline phosphatase (APISO): 55 U/L (ref 37–153)
BUN: 11 mg/dL (ref 7–25)
CO2: 29 mmol/L (ref 20–32)
Calcium: 9.6 mg/dL (ref 8.6–10.4)
Chloride: 108 mmol/L (ref 98–110)
Creat: 0.66 mg/dL (ref 0.50–1.05)
GFR, Est African American: 114 mL/min/{1.73_m2} (ref >=60)
GFR, Est Non African American: 98 mL/min/{1.73_m2} (ref >=60)
Globulin: 2.6 g/dL (calc) (ref 1.9–3.7)
Glucose, Bld: 86 mg/dL (ref 65–99)
Potassium: 4.4 mmol/L (ref 3.5–5.3)
Sodium: 143 mmol/L (ref 135–146)
Total Bilirubin: 0.4 mg/dL (ref 0.2–1.2)
Total Protein: 6.8 g/dL (ref 6.1–8.1)

## 2020-08-13 LAB — CBC WITH DIFFERENTIAL/PLATELET
Absolute Monocytes: 353 cells/uL (ref 200–950)
Basophils Absolute: 50 cells/uL (ref 0–200)
Basophils Relative: 1.2 %
Eosinophils Absolute: 113 cells/uL (ref 15–500)
Eosinophils Relative: 2.7 %
HCT: 36 % (ref 35.0–45.0)
Hemoglobin: 11.6 g/dL — ABNORMAL LOW (ref 11.7–15.5)
Lymphs Abs: 1235 cells/uL (ref 850–3900)
MCH: 26.5 pg — ABNORMAL LOW (ref 27.0–33.0)
MCHC: 32.2 g/dL (ref 32.0–36.0)
MCV: 82.2 fL (ref 80.0–100.0)
MPV: 11.9 fL (ref 7.5–12.5)
Monocytes Relative: 8.4 %
Neutro Abs: 2449 cells/uL (ref 1500–7800)
Neutrophils Relative %: 58.3 %
Platelets: 212 10*3/uL (ref 140–400)
RBC: 4.38 10*6/uL (ref 3.80–5.10)
RDW: 13.3 % (ref 11.0–15.0)
Total Lymphocyte: 29.4 %
WBC: 4.2 10*3/uL (ref 3.8–10.8)

## 2020-08-21 ENCOUNTER — Ambulatory Visit: Payer: BC Managed Care – PPO | Admitting: Rheumatology

## 2020-08-21 ENCOUNTER — Other Ambulatory Visit: Payer: Self-pay

## 2020-08-21 ENCOUNTER — Encounter: Payer: Self-pay | Admitting: Rheumatology

## 2020-08-21 VITALS — BP 145/79 | HR 81 | Resp 15 | Ht 62.0 in | Wt 211.2 lb

## 2020-08-21 DIAGNOSIS — F5101 Primary insomnia: Secondary | ICD-10-CM

## 2020-08-21 DIAGNOSIS — Z79899 Other long term (current) drug therapy: Secondary | ICD-10-CM

## 2020-08-21 DIAGNOSIS — E559 Vitamin D deficiency, unspecified: Secondary | ICD-10-CM | POA: Diagnosis not present

## 2020-08-21 DIAGNOSIS — M7061 Trochanteric bursitis, right hip: Secondary | ICD-10-CM | POA: Diagnosis not present

## 2020-08-21 DIAGNOSIS — Z8679 Personal history of other diseases of the circulatory system: Secondary | ICD-10-CM

## 2020-08-21 DIAGNOSIS — M0609 Rheumatoid arthritis without rheumatoid factor, multiple sites: Secondary | ICD-10-CM | POA: Diagnosis not present

## 2020-08-21 DIAGNOSIS — E782 Mixed hyperlipidemia: Secondary | ICD-10-CM

## 2020-08-21 DIAGNOSIS — R5383 Other fatigue: Secondary | ICD-10-CM

## 2020-08-21 DIAGNOSIS — M7062 Trochanteric bursitis, left hip: Secondary | ICD-10-CM

## 2020-08-21 DIAGNOSIS — Z7189 Other specified counseling: Secondary | ICD-10-CM

## 2020-08-21 NOTE — Patient Instructions (Addendum)
Standing Labs We placed an order today for your standing lab work.   Please have your standing labs drawn in May  If possible, please have your labs drawn 2 weeks prior to your appointment so that the provider can discuss your results at your appointment.  We have open lab daily Monday through Thursday from 8:30-12:30 PM and 1:30-4:30 PM and Friday from 8:30-12:30 PM and 1:30-4:00 PM at the office of Dr. Bo Merino, [facility].   Please be advised, patients with office appointments requiring lab work will take precedents over walk-in lab work.  If possible, please come for your lab work on Monday and Friday afternoons, as you may experience shorter wait times. The office is located at [address] [place], [place], [place] [address] No appointment is necessary.   Labs are drawn by [facility]. Please bring your co-pay at the time of your lab draw.  You may receive a bill from [facility] for your lab work.  If you wish to have your labs drawn at another location, please call the office 24 hours in advance to send orders.  If you have any questions regarding directions or hours of operation,  please call [phone].   As a reminder, please drink plenty of water prior to coming for your lab work. Thanks!  COVID-19 vaccine recommendations:   COVID-19 vaccine is recommended for everyone (unless you are allergic to a vaccine component), even if you are on a medication that suppresses your immune system.   Do not take Tylenol or any anti-inflammatory medications (NSAIDs) 24 hours prior to the COVID-19 vaccination.   There is no direct evidence about the efficacy of the COVID-19 vaccine in individuals who are on medications that suppress the immune system.   Even if you are fully vaccinated, and you are on any medications that suppress your immune system, please continue to wear a mask, maintain at least six feet social distance and practice hand hygiene.   If you develop a  COVID-19 infection, please contact your PCP or our office to determine if you need monoclonal antibody infusion.  The booster vaccine is now available for immunocompromised patients.   Please see the following web sites for updated information.   [email or web address]

## 2020-10-04 ENCOUNTER — Telehealth: Payer: Self-pay

## 2020-10-04 DIAGNOSIS — M0609 Rheumatoid arthritis without rheumatoid factor, multiple sites: Secondary | ICD-10-CM

## 2020-10-04 MED ORDER — HYDROXYCHLOROQUINE SULFATE 200 MG PO TABS: ORAL_TABLET | ORAL | 0 refills | Status: DC

## 2020-10-04 NOTE — Telephone Encounter (Signed)
Last Visit: 08/21/2020 Next Visit: 01/22/2021 Labs: 08/13/2020 CMP WNL. Hemoglobin is borderline low. WBC count has returned to WNL. Eye exam:  04/2020 WNL  Current Dose per office note 08/21/2020: PLQ 200 mg BID M-F.  DX: Rheumatoid arthritis of multiple sites with negative rheumatoid factor   Okay to refill Plaquenil?

## 2020-10-04 NOTE — Telephone Encounter (Signed)
Patient left a voicemail requesting prescription refill of Hydroxychloroquine.  Patient states the specialty pharmacy needs an updated prescription.

## 2020-10-24 DIAGNOSIS — I10 Essential (primary) hypertension: Secondary | ICD-10-CM | POA: Diagnosis not present

## 2020-10-24 DIAGNOSIS — J3089 Other allergic rhinitis: Secondary | ICD-10-CM | POA: Diagnosis not present

## 2020-10-24 DIAGNOSIS — F439 Reaction to severe stress, unspecified: Secondary | ICD-10-CM | POA: Diagnosis not present

## 2020-10-24 DIAGNOSIS — E785 Hyperlipidemia, unspecified: Secondary | ICD-10-CM | POA: Diagnosis not present

## 2020-10-24 DIAGNOSIS — E559 Vitamin D deficiency, unspecified: Secondary | ICD-10-CM | POA: Diagnosis not present

## 2020-10-24 DIAGNOSIS — Z1159 Encounter for screening for other viral diseases: Secondary | ICD-10-CM | POA: Diagnosis not present

## 2020-12-09 ENCOUNTER — Other Ambulatory Visit: Payer: Self-pay | Admitting: Physician Assistant

## 2020-12-09 DIAGNOSIS — M0609 Rheumatoid arthritis without rheumatoid factor, multiple sites: Secondary | ICD-10-CM

## 2020-12-09 NOTE — Telephone Encounter (Signed)
Last Visit: 08/21/2020 Next Visit: 01/22/2021 Labs: 08/13/2020 CMP WNL. Hemoglobin is borderline low. WBC count has returned to WNL. We will continue to monitor.  Eye exam: 04/2020 WNL  Current Dose per office note on 08/21/2020: PLQ 200 mg BID M-F. CR:[id number] arthritis of multiple sites with negative rheumatoid factor  Last Fill: 10/04/2020   Okay to refill Plaquenil?

## 2021-01-08 NOTE — Progress Notes (Signed)
Office Visit Note  Patient: Stacey Turner             Date of Birth: Apr 08, 1961           MRN: [id number]             PCP: [name], MD Referring: [name], MD Visit Date: 01/22/2021 Occupation: @GUAROCC @  Subjective:  Medication management.   History of Present Illness: Stacey Turner is a 60 y.o. female with a history of seronegative rheumatoid arthritis and osteoarthritis.  She continues to have some discomfort in her hands after activity from osteoarthritis.  She has not had a flare of rheumatoid arthritis.  She has been tolerating hydroxychloroquine well.  She states she has been doing stretches for trochanteric bursitis which has been helpful.  She has not had any major flares of trochanteric bursitis.  She has not been taking vitamin D on a regular basis.  She has been experiencing some fatigue.  Activities of Daily Living:  Patient reports morning stiffness for 0 minutes.   Patient Denies nocturnal pain.  Difficulty dressing/grooming: Denies Difficulty climbing stairs: Denies Difficulty getting out of chair: Denies Difficulty using hands for taps, buttons, cutlery, and/or writing: Denies  Review of Systems  Constitutional: Negative for fatigue.  HENT: Negative for mouth sores, mouth dryness and nose dryness.   Eyes: Negative for pain, itching and dryness.  Respiratory: Negative for shortness of breath and difficulty breathing.   Cardiovascular: Negative for chest pain and palpitations.  Gastrointestinal: Negative for blood in stool, constipation and diarrhea.  Endocrine: Negative for increased urination.  Genitourinary: Negative for difficulty urinating.  Musculoskeletal: Negative for arthralgias, joint pain, joint swelling, myalgias, morning stiffness, muscle tenderness and myalgias.  Skin: Negative for color change, rash and redness.  Allergic/Immunologic: Negative for susceptible to infections.  Neurological: Negative for dizziness, numbness, headaches,  memory loss and weakness.  Hematological: Positive for bruising/bleeding tendency.  Psychiatric/Behavioral: Negative for confusion.    PMFS History:  Patient Active Problem List   Diagnosis Date Noted  . Primary insomnia 01/16/2017  . Rheumatoid arthritis of multiple sites with negative rheumatoid factor ([place]) 01/05/2017  . High risk medication use 01/05/2017  . Trochanteric bursitis of both hips 01/05/2017  . Palpitations 10/06/2013  . Cardiac murmur 10/06/2013  . Essential hypertension 10/06/2013  . Hyperlipidemia 10/06/2013  . S/P myomectomy 03/14/2012  . Obesity 01/19/2012  . Fibroids 01/19/2012  . Pelvic pain 01/19/2012    Past Medical History:  Diagnosis Date  . Cardiac murmur   . Fibroid uterus 2001  . H/O abdominal pain 04/24/2011  . H/O dysmenorrhea 2001  . H/O hemorrhoids 2006  . H/O: menorrhagia 2001  . Headache(784.0)   . High cholesterol 2003  . 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 Hypertension   . Irregular periods/menstrual cycles 1999  . Monilial vaginitis 2003  . Palpitations   . Pelvic pain in female 11/25/2011  . PONV (postoperative nausea and vomiting)   . Rheumatoid arthritis ([place])   . Sinusitis 2009  . Stress 2004  . Yeast vaginitis 2004    Family History  Problem Relation Age of Onset  . Heart failure Mother   . Cancer Father        Colon  . Breast cancer Neg Hx    Past Surgical History:  Procedure Laterality Date  . BREAST BIOPSY Left 2016  . BUNIONECTOMY    . GANGLION CYST EXCISION    . MYOMECTOMY  12/09/2011   Procedure: MYOMECTOMY;  Surgeon: Arnell Sieving  Raphael Gibney, MD;  Location: [facility];  Service: Gynecology;  Laterality: N/A;  Abdominal Myomectomy, Biopsy abdominal Myometrium   Social History   Social History Narrative  . Not on file   Immunization History  Administered Date(s) Administered  . PFIZER([place])SARS-COV-2 Vaccination 05/16/2020, 06/06/2020     Objective: Vital Signs: BP (!) 143/81 (BP Location: Left Arm, Patient Position:  Sitting, Cuff Size: Normal)   Pulse 79   Resp 14   Ht 5\' 2"  (1.575 m)   Wt 204 lb 6.4 oz (92.7 kg)   LMP 12/27/2013   BMI 37.39 kg/m    Physical Exam Vitals and nursing note reviewed.  Constitutional:      Appearance: She is well-developed.  HENT:     Head: Normocephalic and atraumatic.  Eyes:     Conjunctiva/sclera: Conjunctivae normal.  Cardiovascular:     Rate and Rhythm: Normal rate and regular rhythm.     Heart sounds: Normal heart sounds.  Pulmonary:     Effort: Pulmonary effort is normal.     Breath sounds: Normal breath sounds.  Abdominal:     General: Bowel sounds are normal.     Palpations: Abdomen is soft.  Musculoskeletal:     Cervical back: Normal range of motion.  Lymphadenopathy:     Cervical: No cervical adenopathy.  Skin:    General: Skin is warm and dry.     Capillary Refill: Capillary refill takes less than 2 seconds.  Neurological:     Mental Status: She is alert and oriented to person, place, and time.  Psychiatric:        Behavior: Behavior normal.      Musculoskeletal Exam: C-spine was in good range of motion.  Shoulder joints, elbow joints, wrist joints, MCPs PIPs and DIPs with good range of motion.  She had bilateral PIP and DIP thickening.  No synovitis was noted.  Hip joints, knee joints, ankles, MTPs and PIPs with good range of motion with no synovitis.  She is some tenderness over trochanteric bursa.  CDAI Exam: CDAI Score: -- Patient Global: --; Provider Global: -- Swollen: --; Tender: -- Joint Exam 01/22/2021   No joint exam has been documented for this visit   There is currently no information documented on the homunculus. Go to the Rheumatology activity and complete the homunculus joint exam.  Investigation: No additional findings.  Imaging: No results found.  Recent Labs: Lab Results  Component Value Date   WBC 4.2 08/13/2020   HGB 11.6 (L) 08/13/2020   PLT 212 08/13/2020   NA 143 08/13/2020   K 4.4 08/13/2020   CL  108 08/13/2020   CO2 29 08/13/2020   GLUCOSE 86 08/13/2020   BUN 11 08/13/2020   CREATININE 0.66 08/13/2020   BILITOT 0.4 08/13/2020   ALKPHOS 49 01/19/2017   AST 14 08/13/2020   ALT 19 08/13/2020   PROT 6.8 08/13/2020   ALBUMIN 4.2 01/19/2017   CALCIUM 9.6 08/13/2020   GFRAA 114 08/13/2020    Speciality Comments: PLQ Eye Exam: 04/2020 WNL [facility] Follow up in 1 year  Procedures:  No procedures performed Allergies: Latex and Penicillins   Assessment / Plan:     Visit Diagnoses: Rheumatoid arthritis of multiple sites with negative rheumatoid factor (HCC)-patient has no active synovitis on examination today.  She has been tolerating Plaquenil well.  High risk medication use - PLQ 200 mg BID M-F. PLQ Eye Exam: 04/2020 - Plan: CBC with Differential/Platelet, COMPLETE METABOLIC PANEL WITH GFR today.  She will be getting next eye examination in August.  She had 2 doses of COVID-19 vaccination.  Third dose was discussed.  I also discussed pneumococcal and Shingrix vaccine.  Patient believes she has had pneumococcal vaccine.  Primary osteoarthritis of both hands-she has some stiffness in her hands after doing strenuous activities..  She has bilateral PIP and DIP thickening.  Trochanteric bursitis of both hips-she had mild tenderness over trochanteric bursa.  She states she has been doing stretching exercises which has been helpful.  Vitamin D deficiency -she has history of vitamin D deficiency.  She has not taken vitamin D recently.  Plan: VITAMIN D 25 Hydroxy (Vit-D Deficiency, Fractures).  Her DEXA scan on September 19, 2019 was within normal limits.  Primary insomnia-good sleep hygiene was discussed.  Other fatigue-she continues to have some fatigue.  It may be related to chronic insomnia.  History of hypertension-blood pressures are still elevated.  Have advised her to monitor blood pressure closely and follow-up with her PCP.  Increased risk of heart disease in people with  rheumatoid arthritis was discussed.  Exercise and dietary modifications were discussed.  Handout was placed in the AVS.  Mixed hyperlipidemia  Orders: Orders Placed This Encounter  Procedures  . CBC with Differential/Platelet  . COMPLETE METABOLIC PANEL WITH GFR  . VITAMIN D 25 Hydroxy (Vit-D Deficiency, Fractures)   No orders of the defined types were placed in this encounter.     Follow-Up Instructions: Return in about 5 months (around 06/24/2021) for Rheumatoid arthritis.   Bo Merino, MD  Note - This record has been created using Editor, commissioning.  Chart creation errors have been sought, but may not always  have been located. Such creation errors do not reflect on  the standard of medical care.

## 2021-01-22 ENCOUNTER — Encounter: Payer: Self-pay | Admitting: Rheumatology

## 2021-01-22 ENCOUNTER — Other Ambulatory Visit: Payer: Self-pay

## 2021-01-22 ENCOUNTER — Ambulatory Visit: Payer: BC Managed Care – PPO | Admitting: Rheumatology

## 2021-01-22 VITALS — BP 143/81 | HR 79 | Resp 14 | Ht 62.0 in | Wt 204.4 lb

## 2021-01-22 DIAGNOSIS — Z8679 Personal history of other diseases of the circulatory system: Secondary | ICD-10-CM

## 2021-01-22 DIAGNOSIS — Z79899 Other long term (current) drug therapy: Secondary | ICD-10-CM | POA: Diagnosis not present

## 2021-01-22 DIAGNOSIS — M7061 Trochanteric bursitis, right hip: Secondary | ICD-10-CM

## 2021-01-22 DIAGNOSIS — F5101 Primary insomnia: Secondary | ICD-10-CM

## 2021-01-22 DIAGNOSIS — M19041 Primary osteoarthritis, right hand: Secondary | ICD-10-CM | POA: Diagnosis not present

## 2021-01-22 DIAGNOSIS — M0609 Rheumatoid arthritis without rheumatoid factor, multiple sites: Secondary | ICD-10-CM | POA: Diagnosis not present

## 2021-01-22 DIAGNOSIS — E782 Mixed hyperlipidemia: Secondary | ICD-10-CM

## 2021-01-22 DIAGNOSIS — R5383 Other fatigue: Secondary | ICD-10-CM

## 2021-01-22 DIAGNOSIS — M7062 Trochanteric bursitis, left hip: Secondary | ICD-10-CM

## 2021-01-22 DIAGNOSIS — E559 Vitamin D deficiency, unspecified: Secondary | ICD-10-CM

## 2021-01-22 DIAGNOSIS — M19042 Primary osteoarthritis, left hand: Secondary | ICD-10-CM

## 2021-01-22 NOTE — Patient Instructions (Signed)
Standing Labs We placed an order today for your standing lab work.   Please have your standing labs drawn in October   If possible, please have your labs drawn 2 weeks prior to your appointment so that the provider can discuss your results at your appointment.  We have open lab daily Monday through Thursday from 1:30-4:30 PM and Friday from 1:30-4:00 PM at the office of Dr. Bo Merino, [facility].   Please be advised, all patients with office appointments requiring lab work will take precedents over walk-in lab work.  If possible, please come for your lab work on Monday and Friday afternoons, as you may experience shorter wait times. The office is located at [address], [place], [place], [place] [address] No appointment is necessary.   Labs are drawn by [facility]. Please bring your co-pay at the time of your lab draw.  You may receive a bill from [facility] for your lab work.  If you wish to have your labs drawn at another location, please call the office 24 hours in advance to send orders.  If you have any questions regarding directions or hours of operation,  please call [phone].   As a reminder, please drink plenty of water prior to coming for your lab work. Thanks!  Vaccines You are taking a medication(s) that can suppress your immune system.  The following immunizations are recommended: . Flu annually . Covid-19  . Pneumonia (Pneumovax 23 and Prevnar 13 spaced at least 1 year apart) . Shingrix (after age 7)  Please check with your PCP to make sure you are up to date.  Heart Disease Prevention   Your inflammatory disease increases your risk of heart disease which includes heart attack, stroke, atrial fibrillation (irregular heartbeats), high blood pressure, heart failure and atherosclerosis (plaque in the arteries).  It is important to reduce your risk by:   . Keep blood pressure, cholesterol, and blood sugar at healthy levels   . Smoking Cessation    . Maintain a healthy weight  o BMI 20-25   . Eat a healthy diet  o Plenty of fresh fruit, vegetables, and whole grains  o Limit saturated fats, foods high in sodium, and added sugars  o DASH and Mediterranean diet   . Increase physical activity  o Recommend moderate physically activity for 150 minutes per week/ 30 minutes a day for five days a week These can be broken up into three separate ten-minute sessions during the day.   . Reduce Stress  . Meditation, slow breathing exercises, yoga, coloring books  . Dental visits twice a year

## 2021-01-23 ENCOUNTER — Other Ambulatory Visit: Payer: Self-pay | Admitting: *Deleted

## 2021-01-23 DIAGNOSIS — E559 Vitamin D deficiency, unspecified: Secondary | ICD-10-CM

## 2021-01-23 LAB — COMPLETE METABOLIC PANEL WITH GFR
AG Ratio: 1.7 (calc) (ref 1.0–2.5)
ALT: 19 U/L (ref 6–29)
AST: 15 U/L (ref 10–35)
Albumin: 4.3 g/dL (ref 3.6–5.1)
Alkaline phosphatase (APISO): 55 U/L (ref 37–153)
BUN: 9 mg/dL (ref 7–25)
CO2: 26 mmol/L (ref 20–32)
Calcium: 9.3 mg/dL (ref 8.6–10.4)
Chloride: 108 mmol/L (ref 98–110)
Creat: 0.74 mg/dL (ref 0.50–1.05)
GFR, Est African American: 104 mL/min/{1.73_m2} (ref >=60)
GFR, Est Non African American: 90 mL/min/{1.73_m2} (ref >=60)
Globulin: 2.6 g/dL (calc) (ref 1.9–3.7)
Glucose, Bld: 90 mg/dL (ref 65–99)
Potassium: 3.9 mmol/L (ref 3.5–5.3)
Sodium: 143 mmol/L (ref 135–146)
Total Bilirubin: 0.3 mg/dL (ref 0.2–1.2)
Total Protein: 6.9 g/dL (ref 6.1–8.1)

## 2021-01-23 LAB — CBC WITH DIFFERENTIAL/PLATELET
Absolute Monocytes: 452 cells/uL (ref 200–950)
Basophils Absolute: 42 cells/uL (ref 0–200)
Basophils Relative: 1.1 %
Eosinophils Absolute: 171 cells/uL (ref 15–500)
Eosinophils Relative: 4.5 %
HCT: 36.3 % (ref 35.0–45.0)
Hemoglobin: 11.5 g/dL — ABNORMAL LOW (ref 11.7–15.5)
Lymphs Abs: 1026 cells/uL (ref 850–3900)
MCH: 26.3 pg — ABNORMAL LOW (ref 27.0–33.0)
MCHC: 31.7 g/dL — ABNORMAL LOW (ref 32.0–36.0)
MCV: 82.9 fL (ref 80.0–100.0)
MPV: 11.8 fL (ref 7.5–12.5)
Monocytes Relative: 11.9 %
Neutro Abs: 2109 cells/uL (ref 1500–7800)
Neutrophils Relative %: 55.5 %
Platelets: 231 10*3/uL (ref 140–400)
RBC: 4.38 10*6/uL (ref 3.80–5.10)
RDW: 13.1 % (ref 11.0–15.0)
Total Lymphocyte: 27 %
WBC: 3.8 10*3/uL (ref 3.8–10.8)

## 2021-01-23 LAB — VITAMIN D 25 HYDROXY (VIT D DEFICIENCY, FRACTURES): Vit D, 25-Hydroxy: 26 ng/mL — ABNORMAL LOW (ref 30–100)

## 2021-01-23 MED ORDER — VITAMIN D (ERGOCALCIFEROL) 1.25 MG (50000 UNIT) PO CAPS: 50000.0000 [IU] | ORAL_CAPSULE | ORAL | 0 refills | Status: DC

## 2021-03-03 ENCOUNTER — Other Ambulatory Visit: Payer: Self-pay | Admitting: Physician Assistant

## 2021-03-03 DIAGNOSIS — M0609 Rheumatoid arthritis without rheumatoid factor, multiple sites: Secondary | ICD-10-CM

## 2021-03-03 NOTE — Telephone Encounter (Signed)
Last Visit: 01/22/2021  Next Visit: 06/25/2021  Labs: 01/22/2021 CMP WNL.  Hgb is borderline low but stable.    Eye exam: 04/2020 WNL   Current Dose per office note 01/22/2021: PLQ 200 mg BID M-F  FO:YDXAJOINOM arthritis of multiple sites with negative rheumatoid factor  Last Fill: 12/09/2020  Okay to refill Plaquenil?

## 2021-03-16 ENCOUNTER — Other Ambulatory Visit: Payer: Self-pay | Admitting: Physician Assistant

## 2021-04-04 ENCOUNTER — Ambulatory Visit: Payer: BC Managed Care – PPO | Attending: Critical Care Medicine

## 2021-04-04 DIAGNOSIS — Z20822 Contact with and (suspected) exposure to covid-19: Secondary | ICD-10-CM | POA: Diagnosis not present

## 2021-04-05 LAB — SARS-COV-2, NAA 2 DAY TAT

## 2021-04-05 LAB — NOVEL CORONAVIRUS, NAA: SARS-CoV-2, NAA: NOT DETECTED

## 2021-04-18 ENCOUNTER — Other Ambulatory Visit: Payer: Self-pay | Admitting: Family Medicine

## 2021-04-18 DIAGNOSIS — Z1231 Encounter for screening mammogram for malignant neoplasm of breast: Secondary | ICD-10-CM

## 2021-04-20 ENCOUNTER — Other Ambulatory Visit: Payer: Self-pay | Admitting: Physician Assistant

## 2021-04-21 ENCOUNTER — Telehealth: Payer: Self-pay | Admitting: Rheumatology

## 2021-04-21 NOTE — Telephone Encounter (Signed)
Patient calling to request refill on Vit D, sent to [facility] on [address]

## 2021-04-21 NOTE — Telephone Encounter (Signed)
I called patient, Vitamin D lab needs to be repeated, patient verbalized understanding.

## 2021-04-23 DIAGNOSIS — J3089 Other allergic rhinitis: Secondary | ICD-10-CM | POA: Diagnosis not present

## 2021-04-23 DIAGNOSIS — E559 Vitamin D deficiency, unspecified: Secondary | ICD-10-CM | POA: Diagnosis not present

## 2021-04-23 DIAGNOSIS — E785 Hyperlipidemia, unspecified: Secondary | ICD-10-CM | POA: Diagnosis not present

## 2021-04-23 DIAGNOSIS — I10 Essential (primary) hypertension: Secondary | ICD-10-CM | POA: Diagnosis not present

## 2021-04-24 ENCOUNTER — Telehealth: Payer: Self-pay | Admitting: Rheumatology

## 2021-04-24 NOTE — Telephone Encounter (Signed)
Patient calling to let you know her PCP drew a Vit D level yesterday. Dr. Caren Griffins White's office to fax over results.

## 2021-04-24 NOTE — Telephone Encounter (Signed)
Patient advised we received her results from PCP. Patient advised her Vitamin D level is now 36. Reviewed by Hazel Sams, PA-C and recommendation is to continue vitamin D 2000 unit daily. Patient expressed understanding.

## 2021-05-17 ENCOUNTER — Other Ambulatory Visit: Payer: Self-pay

## 2021-05-17 ENCOUNTER — Emergency Department (HOSPITAL_COMMUNITY): Payer: BC Managed Care – PPO

## 2021-05-17 ENCOUNTER — Emergency Department (HOSPITAL_COMMUNITY)
Admission: EM | Admit: 2021-05-17 | Discharge: 2021-05-17 | Disposition: A | Discharge status: Home / Self Care | Payer: BC Managed Care – PPO | Attending: Emergency Medicine | Admitting: Emergency Medicine

## 2021-05-17 ENCOUNTER — Encounter (HOSPITAL_COMMUNITY): Payer: Self-pay

## 2021-05-17 DIAGNOSIS — F439 Reaction to severe stress, unspecified: Secondary | ICD-10-CM | POA: Diagnosis not present

## 2021-05-17 DIAGNOSIS — Z91040 Latex allergy status: Secondary | ICD-10-CM | POA: Insufficient documentation

## 2021-05-17 DIAGNOSIS — Z79899 Other long term (current) drug therapy: Secondary | ICD-10-CM | POA: Insufficient documentation

## 2021-05-17 DIAGNOSIS — R102 Pelvic and perineal pain: Secondary | ICD-10-CM | POA: Diagnosis not present

## 2021-05-17 DIAGNOSIS — R079 Chest pain, unspecified: Secondary | ICD-10-CM | POA: Diagnosis not present

## 2021-05-17 DIAGNOSIS — I10 Essential (primary) hypertension: Secondary | ICD-10-CM | POA: Insufficient documentation

## 2021-05-17 DIAGNOSIS — Z566 Other physical and mental strain related to work: Secondary | ICD-10-CM

## 2021-05-17 DIAGNOSIS — B373 Candidiasis of vulva and vagina: Secondary | ICD-10-CM | POA: Diagnosis not present

## 2021-05-17 LAB — BASIC METABOLIC PANEL
Anion gap: 11 (ref 5–15)
BUN: 13 mg/dL (ref 6–20)
CO2: 25 mmol/L (ref 22–32)
Calcium: 9.1 mg/dL (ref 8.9–10.3)
Chloride: 105 mmol/L (ref 98–111)
Creatinine, Ser: 0.74 mg/dL (ref 0.44–1.00)
GFR, Estimated: >60 mL/min (ref >60)
Glucose, Bld: 116 mg/dL — ABNORMAL HIGH (ref 70–99)
Potassium: 3.1 mmol/L — ABNORMAL LOW (ref 3.5–5.1)
Sodium: 141 mmol/L (ref 135–145)

## 2021-05-17 LAB — CBC
HCT: 35.7 % — ABNORMAL LOW (ref 36.0–46.0)
Hemoglobin: 11.6 g/dL — ABNORMAL LOW (ref 12.0–15.0)
MCH: 26.8 pg (ref 26.0–34.0)
MCHC: 32.5 g/dL (ref 30.0–36.0)
MCV: 82.4 fL (ref 80.0–100.0)
Platelets: 243 10*3/uL (ref 150–400)
RBC: 4.33 MIL/uL (ref 3.87–5.11)
RDW: 13.7 % (ref 11.5–15.5)
WBC: 6.5 10*3/uL (ref 4.0–10.5)
nRBC: 0 % (ref 0.0–0.2)

## 2021-05-17 LAB — TROPONIN I (HIGH SENSITIVITY)
Troponin I (High Sensitivity): 12 ng/L (ref <18)
Troponin I (High Sensitivity): 13 ng/L (ref <18)

## 2021-05-17 LAB — I-STAT BETA HCG BLOOD, ED (MC, WL, AP ONLY): I-stat hCG, quantitative: <5 m[IU]/mL (ref <5)

## 2021-05-17 NOTE — ED Triage Notes (Signed)
Pt reports sternal chest pains that worsened around 2330 last night. Pain worse with applied pressure. Reports similar episode 2 weeks ago when lifting jugs of water.

## 2021-05-17 NOTE — ED Provider Notes (Signed)
[facility] DEPT Provider Note   CSN: [id number] Arrival date & time: 05/17/21  0027     History Chief Complaint  Patient presents with   Chest Pain    Stacey Turner is a 60 y.o. female.  Patient presents to the emergency department for evaluation of chest pains.  Patient reports that she first noticed the pain around 11:30 PM.  It is mostly a discomfort.  She reports that she is not experiencing the pain right now.  She became concerned earlier when she felt some tingling in her hand on the left side.  No weakness.  Patient denies shortness of breath.  She reports that she has been under a lot of stress at work recently.      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   Hypertension    Irregular periods/menstrual cycles 1999   Monilial vaginitis 2003   Palpitations    Pelvic pain in female 11/25/2011   PONV (postoperative nausea and vomiting)    Rheumatoid arthritis ([place])    Sinusitis 2009   Stress 2004   Yeast vaginitis 2004    Patient Active Problem List   Diagnosis Date Noted   Primary insomnia 01/16/2017   Rheumatoid arthritis of multiple sites with negative rheumatoid factor ([place]) 01/05/2017   High risk medication use 01/05/2017   Trochanteric bursitis of both hips 01/05/2017   Palpitations 10/06/2013   Cardiac murmur 10/06/2013   Essential hypertension 10/06/2013   Hyperlipidemia 10/06/2013   S/P myomectomy 03/14/2012   Obesity 01/19/2012   Fibroids 01/19/2012   Pelvic pain 01/19/2012    Past Surgical History:  Procedure Laterality Date   BREAST BIOPSY Left 2016   BUNIONECTOMY     GANGLION CYST EXCISION     MYOMECTOMY  12/09/2011   Procedure: MYOMECTOMY;  Surgeon: Ena Dawley, MD;  Location: [facility];  Service:  Gynecology;  Laterality: N/A;  Abdominal Myomectomy, Biopsy abdominal Myometrium     OB History   No obstetric history on file.     Family History  Problem Relation Age of Onset   Heart failure Mother    Cancer Father        Colon   Breast cancer Neg Hx     Social History   Tobacco Use   Smoking status: Never   Smokeless tobacco: Never  Vaping Use   Vaping Use: Never used  Substance Use Topics   Alcohol use: No   Drug use: No    Home Medications Prior to Admission medications   Medication Sig Start Date End Date Taking? Authorizing Provider  hydroxychloroquine (PLAQUENIL) 200 MG tablet TAKE 1 TABLET TWICE A DAY ON MONDAY THROUGH FRIDAY ONLY 03/03/21   Ofilia Neas, PA-C  enalapril (VASOTEC) 10 MG tablet Take 10 mg by mouth daily. 10/19/19   [provider]  fluticasone (FLONASE) 50 MCG/ACT nasal spray Place into both nostrils every other day.    [provider]  montelukast (SINGULAIR) 10 MG tablet Take 10 mg by mouth at bedtime.    [provider]  pravastatin (PRAVACHOL) 40 MG tablet Take 40 mg by mouth every morning.    [provider]  triamcinolone (NASACORT) 55 MCG/ACT nasal inhaler Place 1 spray into the  nose daily. Alternates with Astelin    [provider]  triamcinolone cream (KENALOG) 0.5 % APPLY SPARINGLY TO AFFECTED AREA TWICE A DAY EXTERNALLY 14 DAYS 03/27/19   [provider]  Vitamin D, Ergocalciferol, (DRISDOL) 1.25 MG (50000 UNIT) CAPS capsule Take 1 capsule (50,000 Units total) by mouth every 7 (seven) days. 01/23/21   Ofilia Neas, PA-C    Allergies    Latex and Penicillins  Review of Systems   Review of Systems  Respiratory:  Negative for shortness of breath.   Cardiovascular:  Positive for chest pain.  Psychiatric/Behavioral:  Positive for sleep disturbance. The patient is nervous/anxious.   All other systems reviewed and are negative.  Physical Exam Updated Vital Signs BP 120/70   Pulse 88    Temp 98.1 F (36.7 C) (Oral)   Resp 18   Ht '5\' 2"'$  (1.575 m)   Wt 93 kg   LMP 12/27/2013   SpO2 99%   BMI 37.49 kg/m   Physical Exam Vitals and nursing note reviewed.  Constitutional:      General: She is not in acute distress.    Appearance: Normal appearance. She is well-developed.  HENT:     Head: Normocephalic and atraumatic.     Right Ear: Hearing normal.     Left Ear: Hearing normal.     Nose: Nose normal.  Eyes:     Conjunctiva/sclera: Conjunctivae normal.     Pupils: Pupils are equal, round, and reactive to light.  Cardiovascular:     Rate and Rhythm: Regular rhythm.     Heart sounds: S1 normal and S2 normal. No murmur heard.   No friction rub. No gallop.  Pulmonary:     Effort: Pulmonary effort is normal. No respiratory distress.     Breath sounds: Normal breath sounds.  Chest:     Chest wall: No tenderness.  Abdominal:     General: Bowel sounds are normal.     Palpations: Abdomen is soft.     Tenderness: There is no abdominal tenderness. There is no guarding or rebound. Negative signs include Murphy's sign and McBurney's sign.     Hernia: No hernia is present.  Musculoskeletal:        General: Normal range of motion.     Cervical back: Normal range of motion and neck supple.  Skin:    General: Skin is warm and dry.     Findings: No rash.  Neurological:     Mental Status: She is alert and oriented to person, place, and time.     GCS: GCS eye subscore is 4. GCS verbal subscore is 5. GCS motor subscore is 6.     Cranial Nerves: No cranial nerve deficit.     Sensory: No sensory deficit.     Coordination: Coordination normal.  Psychiatric:        Speech: Speech normal.        Behavior: Behavior normal.        Thought Content: Thought content normal.    ED Results / Procedures / Treatments   Labs (all labs ordered are listed, but only abnormal results are displayed) Labs Reviewed  BASIC METABOLIC PANEL - Abnormal; Notable for the following components:       Result Value   Potassium 3.1 (*)    Glucose, Bld 116 (*)    All other components within normal limits  CBC - Abnormal; Notable for the following components:   Hemoglobin 11.6 (*)    HCT 35.7 (*)  All other components within normal limits  I-STAT BETA HCG BLOOD, ED ([facility], [facility], [facility] ONLY)  TROPONIN I (HIGH SENSITIVITY)  TROPONIN I (HIGH SENSITIVITY)    EKG EKG Interpretation  Date/Time:  Saturday May 17 2021 00:37:13 EDT Ventricular Rate:  123 PR Interval:  149 QRS Duration: 80 QT Interval:  324 QTC Calculation: 464 R Axis:   79 Text Interpretation: Sinus tachycardia Multiple ventricular premature complexes Aberrant complex Left atrial enlargement Confirmed by Orpah Greek [phone]) on 05/17/2021 4:04:18 AM  Radiology DG Chest 2 View  Result Date: 05/17/2021 CLINICAL DATA:  Worsening chest pain. EXAM: CHEST - 2 VIEW COMPARISON:  May 20, 2008 FINDINGS: The heart size and mediastinal contours are within normal limits. Both lungs are clear. The visualized skeletal structures are unremarkable. IMPRESSION: No active cardiopulmonary disease. Electronically Signed   By: Virgina Norfolk M.D.   On: 05/17/2021 01:29    Procedures Procedures   Medications Ordered in ED Medications - No data to display  ED Course  I have reviewed the triage vital signs and the nursing notes.  Pertinent labs & imaging results that were available during my care of the patient were reviewed by me and considered in my medical decision making (see chart for details).    MDM Rules/Calculators/A&P                           Patient presents to the emergency department for evaluation of chest pain.  Patient experiencing a discomfort in her chest that occurred at rest.  Discomfort has resolved.  Cardiac work-up was negative.  Patient seems very stressed and anxious.  When I walked into the room patient was normotensive with a heart rate in the 90s.  Within a minute of talking to her blood  pressure went to [id number] systolic with a heart rate of 110.  Patient has been able to relax and rest in the room through the night.  Blood pressure is now normotensive, 120/70, HR 88.  Does not require any further antihypertensives.  With her cardiac rule out here in the department, does not require further work-up here.  Patient indicates that she is experiencing severe stress at work and this is likely the cause of the elevated blood pressures.  Will need to follow-up with her doctor for further management. Final Clinical Impression(s) / ED Diagnoses Final diagnoses:  Stress at work  Primary hypertension    Rx / DC Orders ED Discharge Orders     None        Aidan Moten, Gwenyth Allegra, MD 05/17/21 [phone]

## 2021-05-26 ENCOUNTER — Other Ambulatory Visit: Payer: Self-pay | Admitting: Physician Assistant

## 2021-05-26 DIAGNOSIS — M0609 Rheumatoid arthritis without rheumatoid factor, multiple sites: Secondary | ICD-10-CM

## 2021-05-27 NOTE — Telephone Encounter (Signed)
Patient advised she is due to update her PLQ eye exam. Patient states she is scheduled for 06/27/2021.    Patient states she does not need a refill at this time.

## 2021-06-09 ENCOUNTER — Ambulatory Visit
Admission: RE | Admit: 2021-06-09 | Discharge: 2021-06-09 | Disposition: A | Discharge status: Home / Self Care | Payer: BC Managed Care – PPO | Source: Ambulatory Visit | Attending: Family Medicine | Admitting: Family Medicine

## 2021-06-09 ENCOUNTER — Other Ambulatory Visit: Payer: Self-pay

## 2021-06-09 DIAGNOSIS — Z1231 Encounter for screening mammogram for malignant neoplasm of breast: Secondary | ICD-10-CM

## 2021-06-11 NOTE — Progress Notes (Signed)
Office Visit Note  Patient: Stacey Turner             Date of Birth: 1961/07/12           MRN: [id number]             PCP: [name], MD Referring: [name], MD Visit Date: 06/25/2021 Occupation: @GUAROCC @  Subjective:  Medication management   History of Present Illness: Stacey Turner is a 60 y.o. female with a history of seronegative rheumatoid arthritis and osteoarthritis.  She states she continues to do well on hydroxychloroquine.  She has some stiffness in her hands due to underlying osteoarthritis.  She also has some discomfort in the left trochanteric bursa.  She has not been doing stretching exercises.  She took vitamin D supplement and her vitamin D level was normal recently which was done by Dr. Dema Severin.  She has been taking vitamin D 2000 units daily.  Activities of Daily Living:  Patient reports morning stiffness for 0 minutes.   Patient Denies nocturnal pain.  Difficulty dressing/grooming: Denies Difficulty climbing stairs: Denies Difficulty getting out of chair: Denies Difficulty using hands for taps, buttons, cutlery, and/or writing: Denies  Review of Systems  Constitutional:  Positive for fatigue.  HENT:  Negative for mouth sores, mouth dryness and nose dryness.   Eyes:  Negative for pain, itching and dryness.  Respiratory:  Negative for shortness of breath and difficulty breathing.   Cardiovascular:  Negative for chest pain and palpitations.  Gastrointestinal:  Negative for blood in stool, constipation and diarrhea.  Endocrine: Negative for increased urination.  Genitourinary:  Negative for difficulty urinating.  Musculoskeletal:  Negative for joint pain, joint pain, joint swelling, myalgias, morning stiffness, muscle tenderness and myalgias.  Skin:  Negative for color change, rash, redness and sensitivity to sunlight.  Allergic/Immunologic: Negative for susceptible to infections.  Neurological:  Negative for dizziness, numbness, headaches, memory loss  and weakness.  Hematological:  Positive for bruising/bleeding tendency. Negative for swollen glands.  Psychiatric/Behavioral:  Negative for confusion.    PMFS History:  Patient Active Problem List   Diagnosis Date Noted   Primary insomnia 01/16/2017   Rheumatoid arthritis of multiple sites with negative rheumatoid factor (HCC) 01/05/2017   High risk medication use 01/05/2017   Trochanteric bursitis of both hips 01/05/2017   Palpitations 10/06/2013   Cardiac murmur 10/06/2013   Essential hypertension 10/06/2013   Hyperlipidemia 10/06/2013   S/P myomectomy 03/14/2012   Obesity 01/19/2012   Fibroids 01/19/2012   Pelvic pain 01/19/2012    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   Hypertension    Irregular periods/menstrual cycles 1999   Monilial vaginitis 2003   Palpitations    Pelvic pain in female 11/25/2011   PONV (postoperative nausea and vomiting)    Rheumatoid arthritis ([place])    Sinusitis 2009   Stress 2004   Yeast vaginitis 2004    Family History  Problem Relation Age of Onset   Heart failure Mother    Cancer Father        Colon   Breast cancer Neg Hx    Past Surgical History:  Procedure Laterality Date   BREAST BIOPSY Left 2016   BUNIONECTOMY     GANGLION CYST  EXCISION     MYOMECTOMY  12/09/2011   Procedure: MYOMECTOMY;  Surgeon: Ena Dawley, MD;  Location: [facility];  Service: Gynecology;  Laterality: N/A;  Abdominal Myomectomy, Biopsy abdominal Myometrium   Social History   Social History Narrative   Not on file   Immunization History  Administered Date(s) Administered   PFIZER(Purple Top)SARS-COV-2 Vaccination 05/16/2020, 06/06/2020     Objective: Vital Signs: BP 138/85 (BP Location: Left Arm, Patient Position: Sitting, Cuff Size: Large)    Pulse 84   Resp 12   Ht 5\' 2"  (1.575 m)   Wt 208 lb (94.3 kg)   LMP 12/27/2013   BMI 38.04 kg/m    Physical Exam Vitals and nursing note reviewed.  Constitutional:      Appearance: She is well-developed.  HENT:     Head: Normocephalic and atraumatic.  Eyes:     Conjunctiva/sclera: Conjunctivae normal.  Cardiovascular:     Rate and Rhythm: Normal rate and regular rhythm.     Heart sounds: Normal heart sounds.  Pulmonary:     Effort: Pulmonary effort is normal.     Breath sounds: Normal breath sounds.  Abdominal:     General: Bowel sounds are normal.     Palpations: Abdomen is soft.  Musculoskeletal:     Cervical back: Normal range of motion.  Lymphadenopathy:     Cervical: No cervical adenopathy.  Skin:    General: Skin is warm and dry.     Capillary Refill: Capillary refill takes less than 2 seconds.  Neurological:     Mental Status: She is alert and oriented to person, place, and time.  Psychiatric:        Behavior: Behavior normal.     Musculoskeletal Exam: C-spine was limited range of motion.  Shoulder joints, elbow joints, wrist joints with good range of motion.  She had bilateral PIP and DIP thickening with no synovitis.  Hip joints are in good range of motion.  She tenderness on palpation of the left trochanteric bursa.  Knee joints and ankle joints in good range of motion.  She had no tenderness over MTPs.  CDAI Exam: CDAI Score: -- Patient Global: --; Provider Global: -- Swollen: --; Tender: -- Joint Exam 06/25/2021   No joint exam has been documented for this visit   There is currently no information documented on the homunculus. Go to the Rheumatology activity and complete the homunculus joint exam.  Investigation: No additional findings.  Imaging: MM 3D SCREEN BREAST BILATERAL  Result Date: 06/13/2021 CLINICAL DATA:  Screening. EXAM: DIGITAL SCREENING BILATERAL MAMMOGRAM WITH TOMOSYNTHESIS AND CAD TECHNIQUE: Bilateral screening digital craniocaudal  and mediolateral oblique mammograms were obtained. Bilateral screening digital breast tomosynthesis was performed. The images were evaluated with computer-aided detection. COMPARISON:  Previous exam(s). ACR Breast Density Category b: There are scattered areas of fibroglandular density. FINDINGS: There are no findings suspicious for malignancy. IMPRESSION: No mammographic evidence of malignancy. A result letter of this screening mammogram will be mailed directly to the patient. RECOMMENDATION: Screening mammogram in one year. (Code:SM-B-01Y) BI-RADS CATEGORY  1: Negative. Electronically Signed   By: Kristopher Oppenheim M.D.   On: 06/13/2021 14:42    Recent Labs: Lab Results  Component Value Date   WBC 6.5 05/17/2021   HGB 11.6 (L) 05/17/2021   PLT 243 05/17/2021   NA 141 05/17/2021   K 3.1 (L) 05/17/2021   CL 105 05/17/2021   CO2 25 05/17/2021   GLUCOSE 116 (H) 05/17/2021   BUN 13 05/17/2021  CREATININE 0.74 05/17/2021   BILITOT 0.3 01/22/2021   ALKPHOS 49 01/19/2017   AST 15 01/22/2021   ALT 19 01/22/2021   PROT 6.9 01/22/2021   ALBUMIN 4.2 01/19/2017   CALCIUM 9.1 05/17/2021   GFRAA 104 01/22/2021    Speciality Comments: PLQ Eye Exam: 04/2020 [facility] Follow up in 1 year  Eye Exam scheduled for 07/08/21  Procedures:  No procedures performed Allergies: Latex and Penicillins   Assessment / Plan:     Visit Diagnoses: Rheumatoid arthritis of multiple sites with negative rheumatoid factor (HCC)-she had no synovitis on examination.  She has been tolerating hydroxychloroquine well.  High risk medication use - PLQ 200 mg BID M-F.  Last PLQ Eye Exam: 04/2020.  She states her next eye exam is a scheduled for July 08, 2021.  May 17, 2021 CBC and CMP were stable except for hemoglobin of 11.6.  Use of multivitamin with iron was advised.  Primary osteoarthritis of both hands-she has bilateral PIP and DIP thickening.  Joint protection muscle strengthening was discussed.  A  handout on hand exercises was given.  Trochanteric bursitis of both hips-she had tenderness on palpation of the left trochanteric bursa today.  IT band stretches were discussed and demonstrated.  A handout was given.  Vitamin D deficiency - DEXA scan on September 19, 2019 was within normal limits.  Her vitamin D was normal in August.  She been taking vitamin D 2000 units daily.  We will check vitamin D with next labs in 5 months.  Primary insomnia-improved per patient.  Other fatigue-is better.  Mixed hyperlipidemia  History of hypertension-blood pressure is normal.  Orders: No orders of the defined types were placed in this encounter.  No orders of the defined types were placed in this encounter.    Follow-Up Instructions: Return for Rheumatoid arthritis.   Bo Merino, MD  Note - This record has been created using Editor, commissioning.  Chart creation errors have been sought, but may not always  have been located. Such creation errors do not reflect on  the standard of medical care.

## 2021-06-25 ENCOUNTER — Other Ambulatory Visit: Payer: Self-pay

## 2021-06-25 ENCOUNTER — Encounter: Payer: Self-pay | Admitting: Rheumatology

## 2021-06-25 ENCOUNTER — Ambulatory Visit: Payer: BC Managed Care – PPO | Admitting: Rheumatology

## 2021-06-25 VITALS — BP 138/85 | HR 84 | Resp 12 | Ht 62.0 in | Wt 208.0 lb

## 2021-06-25 DIAGNOSIS — F5101 Primary insomnia: Secondary | ICD-10-CM

## 2021-06-25 DIAGNOSIS — M7061 Trochanteric bursitis, right hip: Secondary | ICD-10-CM | POA: Diagnosis not present

## 2021-06-25 DIAGNOSIS — E782 Mixed hyperlipidemia: Secondary | ICD-10-CM

## 2021-06-25 DIAGNOSIS — M19041 Primary osteoarthritis, right hand: Secondary | ICD-10-CM

## 2021-06-25 DIAGNOSIS — Z79899 Other long term (current) drug therapy: Secondary | ICD-10-CM | POA: Diagnosis not present

## 2021-06-25 DIAGNOSIS — M0609 Rheumatoid arthritis without rheumatoid factor, multiple sites: Secondary | ICD-10-CM

## 2021-06-25 DIAGNOSIS — E559 Vitamin D deficiency, unspecified: Secondary | ICD-10-CM

## 2021-06-25 DIAGNOSIS — R5383 Other fatigue: Secondary | ICD-10-CM

## 2021-06-25 DIAGNOSIS — M7062 Trochanteric bursitis, left hip: Secondary | ICD-10-CM

## 2021-06-25 DIAGNOSIS — Z8679 Personal history of other diseases of the circulatory system: Secondary | ICD-10-CM

## 2021-06-25 DIAGNOSIS — M19042 Primary osteoarthritis, left hand: Secondary | ICD-10-CM

## 2021-06-25 NOTE — Patient Instructions (Addendum)
Hand Exercises Hand exercises can be helpful for almost anyone. These exercises can strengthen the hands, improve flexibility and movement, and increase blood flow to the hands. These results can make work and daily tasks easier. Hand exercises can be especially helpful for people who have joint pain from arthritis or have nerve damage from overuse (carpal tunnel syndrome). These exercises can also help people who have injured a hand. Exercises Most of these hand exercises are gentle stretching and motion exercises. It is usually safe to do them often throughout the day. Warming up your hands before exercise may help to reduce stiffness. You can do this with gentle massage or by placing your hands in warm water for 10-15 minutes. It is normal to feel some stretching, pulling, tightness, or mild discomfort as you begin new exercises. This will gradually improve. Stop an exercise right away if you feel sudden, severe pain or your pain gets worse. Ask your health care provider which exercises are best for you. Knuckle bend or "claw" fist  Stand or sit with your arm, hand, and all five fingers pointed straight up. Make sure to keep your wrist straight during the exercise. Gently bend your fingers down toward your palm until the tips of your fingers are touching the top of your palm. Keep your big knuckle straight and just bend the small knuckles in your fingers. Hold this position for __________ seconds. Straighten (extend) your fingers back to the starting position. Repeat this exercise 5-10 times with each hand. Full finger fist  Stand or sit with your arm, hand, and all five fingers pointed straight up. Make sure to keep your wrist straight during the exercise. Gently bend your fingers into your palm until the tips of your fingers are touching the middle of your palm. Hold this position for __________ seconds. Extend your fingers back to the starting position, stretching every joint fully. Repeat  this exercise 5-10 times with each hand. Straight fist Stand or sit with your arm, hand, and all five fingers pointed straight up. Make sure to keep your wrist straight during the exercise. Gently bend your fingers at the big knuckle, where your fingers meet your hand, and the middle knuckle. Keep the knuckle at the tips of your fingers straight and try to touch the bottom of your palm. Hold this position for __________ seconds. Extend your fingers back to the starting position, stretching every joint fully. Repeat this exercise 5-10 times with each hand. Tabletop  Stand or sit with your arm, hand, and all five fingers pointed straight up. Make sure to keep your wrist straight during the exercise. Gently bend your fingers at the big knuckle, where your fingers meet your hand, as far down as you can while keeping the small knuckles in your fingers straight. Think of forming a tabletop with your fingers. Hold this position for __________ seconds. Extend your fingers back to the starting position, stretching every joint fully. Repeat this exercise 5-10 times with each hand. Finger spread  Place your hand flat on a table with your palm facing down. Make sure your wrist stays straight as you do this exercise. Spread your fingers and thumb apart from each other as far as you can until you feel a gentle stretch. Hold this position for __________ seconds. Bring your fingers and thumb tight together again. Hold this position for __________ seconds. Repeat this exercise 5-10 times with each hand. Making circles  Stand or sit with your arm, hand, and all five fingers pointed   straight up. Make sure to keep your wrist straight during the exercise. Make a circle by touching the tip of your thumb to the tip of your index finger. Hold for __________ seconds. Then open your hand wide. Repeat this motion with your thumb and each finger on your hand. Repeat this exercise 5-10 times with each hand. Thumb  motion  Sit with your forearm resting on a table and your wrist straight. Your thumb should be facing up toward the ceiling. Keep your fingers relaxed as you move your thumb. Lift your thumb up as high as you can toward the ceiling. Hold for __________ seconds. Bend your thumb across your palm as far as you can, reaching the tip of your thumb for the small finger (pinkie) side of your palm. Hold for __________ seconds. Repeat this exercise 5-10 times with each hand. Grip strengthening  Hold a stress ball or other soft ball in the middle of your hand. Slowly increase the pressure, squeezing the ball as much as you can without causing pain. Think of bringing the tips of your fingers into the middle of your palm. All of your finger joints should bend when doing this exercise. Hold your squeeze for __________ seconds, then relax. Repeat this exercise 5-10 times with each hand. Contact a health care provider if: Your hand pain or discomfort gets much worse when you do an exercise. Your hand pain or discomfort does not improve within 2 hours after you exercise. If you have any of these problems, stop doing these exercises right away. Do not do them again unless your health care provider says that you can. Get help right away if: You develop sudden, severe hand pain or swelling. If this happens, stop doing these exercises right away. Do not do them again unless your health care provider says that you can. This information is not intended to replace advice given to you by your health care provider. Make sure you discuss any questions you have with your health care provider. Document Revised: 12/26/2020 Document Reviewed: 12/26/2020 Elsevier Patient Education  [facility] Ask your health care provider which exercises are safe for you. Do exercises exactly as told by your health care provider and adjust them as directed. It is normal to feel mild stretching,  pulling, tightness, or discomfort as you do these exercises. Stop right away if you feel sudden pain or your pain gets significantly worse. Do not begin these exercises until told by your health care provider. Stretching and range-of-motion exercises These exercises warm up your muscles and joints and improve the movement and flexibility of your hip and pelvis. Quadriceps stretch, prone  Lie on your abdomen (prone position) on a firm surface, such as a bed or padded floor. Bend your left / right knee and reach back to hold your ankle or pant leg. If you cannot reach your ankle or pant leg, loop a belt around your foot and grab the belt instead. Gently pull your heel toward your buttocks. Your knee should not slide out to the side. You should feel a stretch in the front of your thigh and knee (quadriceps). Hold this position for __________ seconds. Repeat __________ times. Complete this exercise __________ times a day. Iliotibial band stretch An iliotibial band is a strong band of muscle tissue that runs from the outer side of your hip to the outer side of your thigh and knee. Lie on your side with your left / right leg in the top  position. Bend both of your knees and grab your left / right ankle. Stretch out your bottom arm to help you balance. Slowly bring your top knee back so your thigh goes behind your trunk. Slowly lower your top leg toward the floor until you feel a gentle stretch on the outside of your left / right hip and thigh. If you do not feel a stretch and your knee will not fall farther, place the heel of your other foot on top of your knee and pull your knee down toward the floor with your foot. Hold this position for __________ seconds. Repeat __________ times. Complete this exercise __________ times a day. Strengthening exercises These exercises build strength and endurance in your hip and pelvis. Endurance is the ability to use your muscles for a long time, even after they get  tired. Straight leg raises, side-lying This exercise strengthens the muscles that rotate the leg at the hip and move it away from your body (hip abductors). Lie on your side with your left / right leg in the top position. Lie so your head, shoulder, hip, and knee line up. You may bend your bottom knee to help you balance. Roll your hips slightly forward so your hips are stacked directly over each other and your left / right knee is facing forward. Tense the muscles in your outer thigh and lift your top leg 4-6 inches (10-15 cm). Hold this position for __________ seconds. Slowly lower your leg to return to the starting position. Let your muscles relax completely before doing another repetition. Repeat __________ times. Complete this exercise __________ times a day. Leg raises, prone This exercise strengthens the muscles that move the hips backward (hip extensors). Lie on your abdomen (prone position) on your bed or a firm surface. You can put a pillow under your hips if that is more comfortable for your lower back. Bend your left / right knee so your foot is straight up in the air. Squeeze your buttocks muscles and lift your left / right thigh off the bed. Do not let your back arch. Tense your thigh muscle as hard as you can without increasing any knee pain. Hold this position for __________ seconds. Slowly lower your leg to return to the starting position and allow it to relax completely. Repeat __________ times. Complete this exercise __________ times a day. Hip hike Stand sideways on a bottom step. Stand on your left / right leg with your other foot unsupported next to the step. You can hold on to a railing or wall for balance if needed. Keep your knees straight and your torso square. Then lift your left / right hip up toward the ceiling. Slowly let your left / right hip lower toward the floor, past the starting position. Your foot should get closer to the floor. Do not lean or bend your  knees. Repeat __________ times. Complete this exercise __________ times a day. This information is not intended to replace advice given to you by your health care provider. Make sure you discuss any questions you have with your health care provider. Document Revised: 11/15/2019 Document Reviewed: 11/15/2019 Elsevier Patient Education  [place] We placed an order today for your standing lab work.   Please have your standing labs drawn in January   If possible, please have your labs drawn 2 weeks prior to your appointment so that the provider can discuss your results at your appointment.  Please note that you may see your imaging  and lab results in [facility] before we have reviewed them. We may be awaiting multiple results to interpret others before contacting you. Please allow our office up to 72 hours to thoroughly review all of the results before contacting the office for clarification of your results.  We have open lab daily: Monday through Thursday from 1:30-4:30 PM and Friday from 1:30-4:00 PM at the office of Dr. Bo Merino, [facility].   Please be advised, all patients with office appointments requiring lab work will take precedent over walk-in lab work.  If possible, please come for your lab work on Monday and Friday afternoons, as you may experience shorter wait times. The office is located at [address], [place], [place], [place] [address] No appointment is necessary.   Labs are drawn by [facility]. Please bring your co-pay at the time of your lab draw.  You may receive a bill from [facility] for your lab work.  If you wish to have your labs drawn at another location, please call the office 24 hours in advance to send orders.  If you have any questions regarding directions or hours of operation,  please call [phone].   As a reminder, please drink plenty of water prior to coming for your lab work. Thanks!

## 2021-07-22 DIAGNOSIS — Z79899 Other long term (current) drug therapy: Secondary | ICD-10-CM | POA: Diagnosis not present

## 2021-07-23 ENCOUNTER — Telehealth: Payer: Self-pay | Admitting: Rheumatology

## 2021-07-23 DIAGNOSIS — M0609 Rheumatoid arthritis without rheumatoid factor, multiple sites: Secondary | ICD-10-CM

## 2021-07-23 MED ORDER — HYDROXYCHLOROQUINE SULFATE 200 MG PO TABS: ORAL_TABLET | ORAL | 0 refills | Status: DC

## 2021-07-23 NOTE — Telephone Encounter (Signed)
Patient request refill on Plaquenil sent to [facility].

## 2021-07-23 NOTE — Telephone Encounter (Signed)
Plaquenil sent to [facility]  Next Visit: 11/26/2021  Last Visit: 06/25/2021  Labs: 05/17/2021, Hemoglobin 11.6, HCT 35.7, Potassium 3.1, Glucose 116,   Eye exam: 07/22/2021 per patient, pending fax  Current Dose per office note 06/25/2021: PLQ 200 mg BID M-F  [id number] arthritis of multiple sites with negative rheumatoid factor   Last Fill: 03/03/2021  Okay to refill Plaquenil?

## 2021-08-08 DIAGNOSIS — F419 Anxiety disorder, unspecified: Secondary | ICD-10-CM | POA: Diagnosis not present

## 2021-08-08 DIAGNOSIS — I10 Essential (primary) hypertension: Secondary | ICD-10-CM | POA: Diagnosis not present

## 2021-08-08 DIAGNOSIS — R079 Chest pain, unspecified: Secondary | ICD-10-CM | POA: Diagnosis not present

## 2021-08-08 DIAGNOSIS — Z23 Encounter for immunization: Secondary | ICD-10-CM | POA: Diagnosis not present

## 2021-08-28 ENCOUNTER — Encounter: Payer: Self-pay | Admitting: Cardiology

## 2021-09-29 ENCOUNTER — Other Ambulatory Visit: Payer: Self-pay | Admitting: Physician Assistant

## 2021-09-29 DIAGNOSIS — M0609 Rheumatoid arthritis without rheumatoid factor, multiple sites: Secondary | ICD-10-CM

## 2021-09-29 NOTE — Telephone Encounter (Signed)
Next Visit: 11/26/2021  Last Visit: 06/25/2021  Labs: May 17, 2021 CBC and CMP were stable except for hemoglobin of 11.6.  Eye exam: 07/22/2021 WNL    Current Dose per office note 06/25/2021: PLQ 200 mg BID M-F  YE:[id number] arthritis of multiple sites with negative rheumatoid factor   Last Fill: 07/23/2021  Left message to remind patient she is due to update labs this month.   Okay to refill Plaquenil?

## 2021-10-09 IMAGING — MG MM DIGITAL SCREENING BILAT W/ TOMO AND CAD
8 series · 8 of 24 positions shown · non-contrast
Comparison: Previous exam(s).

CLINICAL DATA: Screening.

EXAM:
DIGITAL SCREENING BILATERAL MAMMOGRAM WITH TOMOSYNTHESIS AND CAD
TECHNIQUE: Bilateral screening digital craniocaudal and mediolateral oblique
mammograms were obtained. Bilateral screening digital breast
tomosynthesis was performed. The images were evaluated with
computer-aided detection.

[R MLO synth-2D]
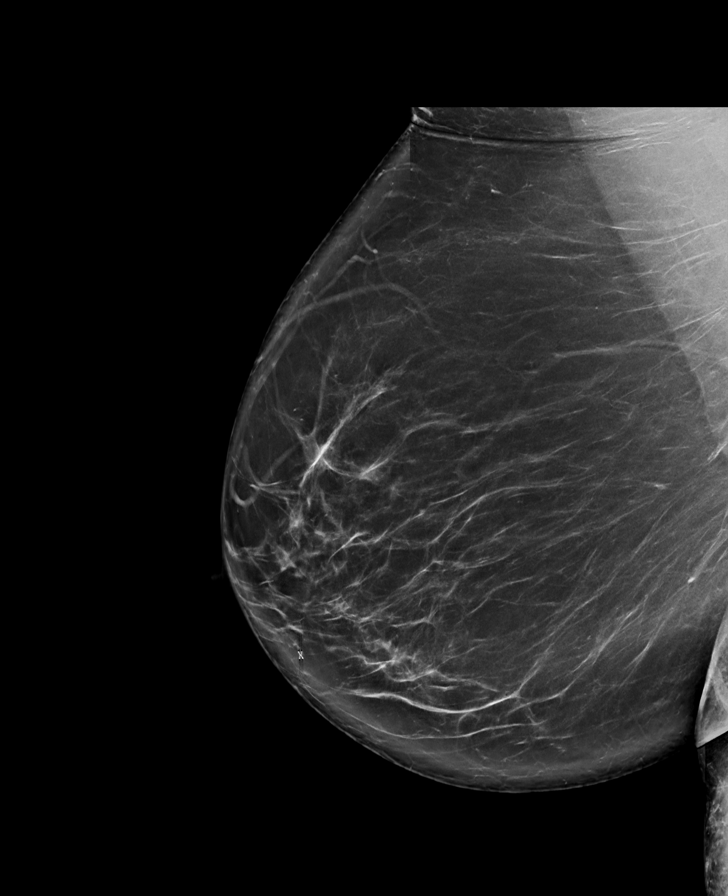

[L CC synth-2D]
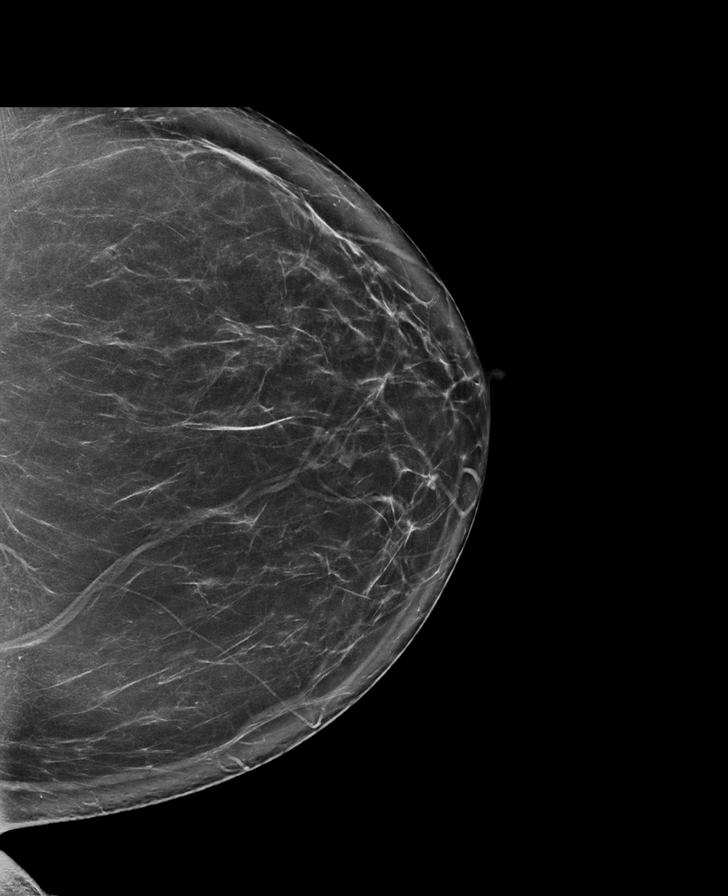

[L MLO synth-2D]
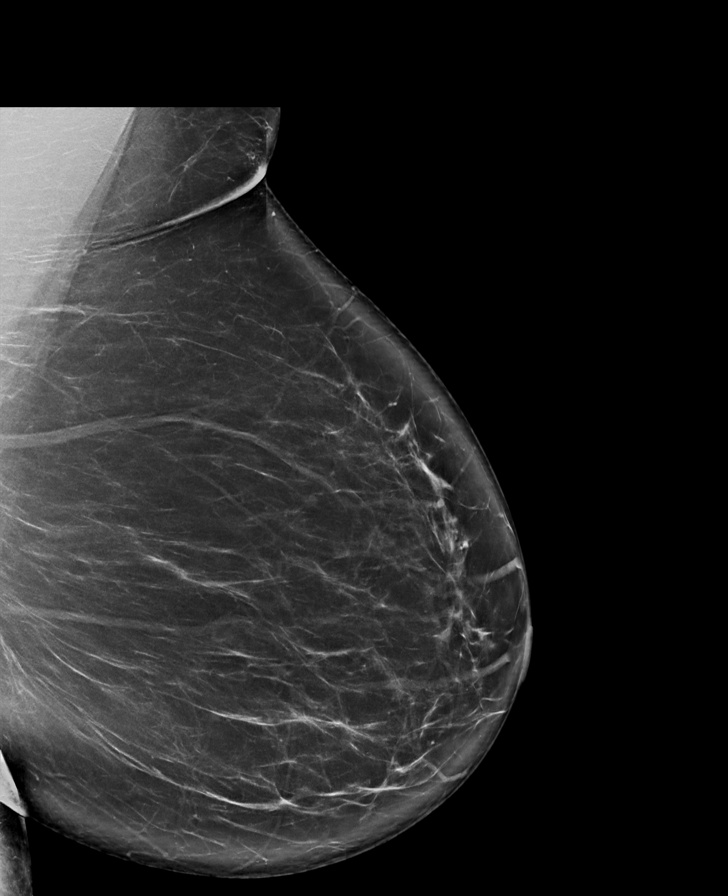

[R CC synth-2D]
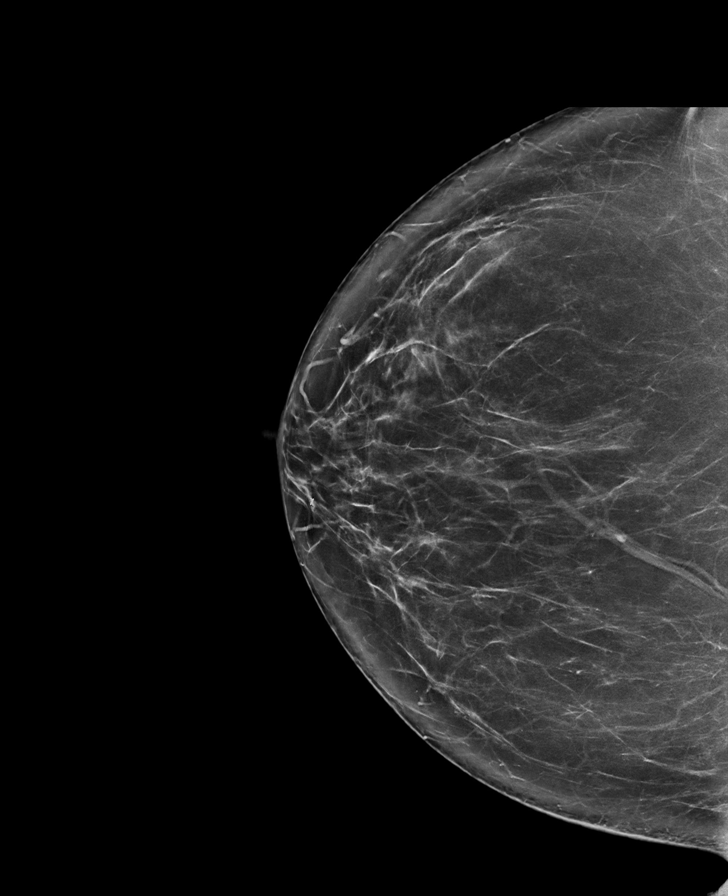

[L MLO tomo · tomo slice 52/103.0]
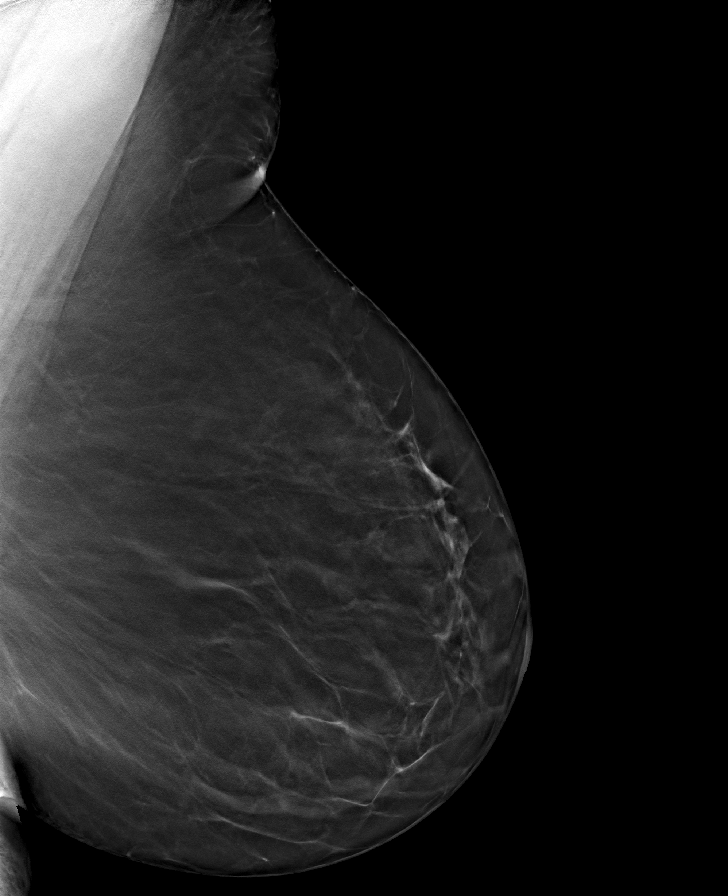

[L CC tomo · tomo slice 45/88.0]
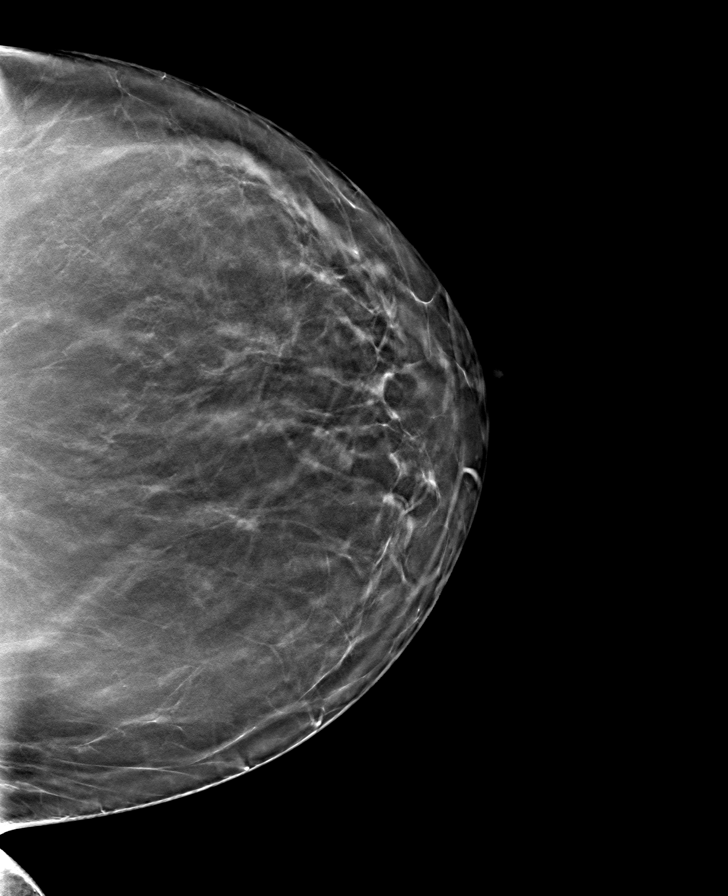

[R MLO tomo · tomo slice 50/99.0]
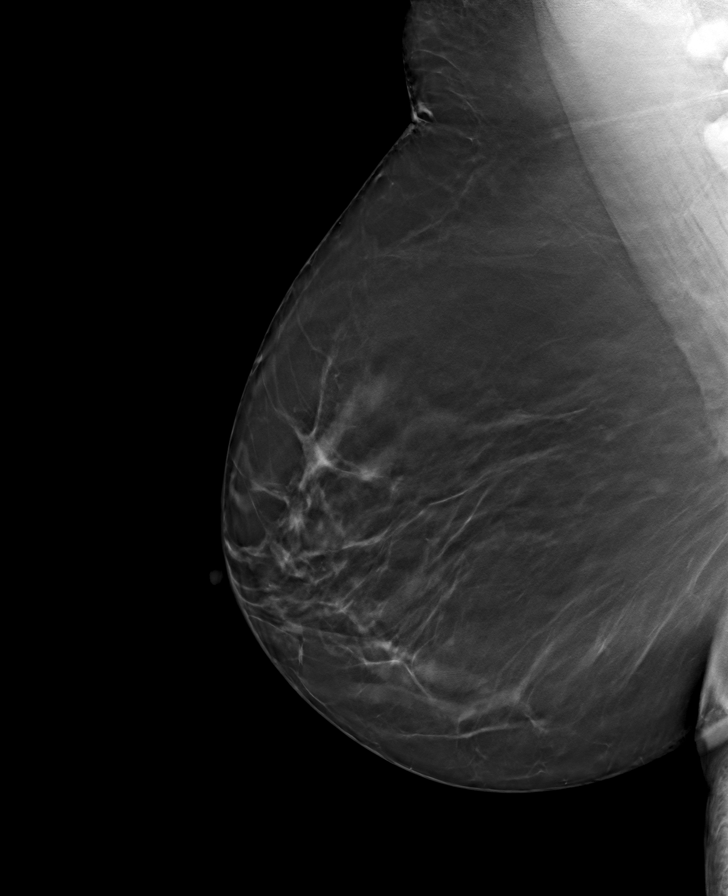

[R CC tomo · tomo slice 43/86.0]
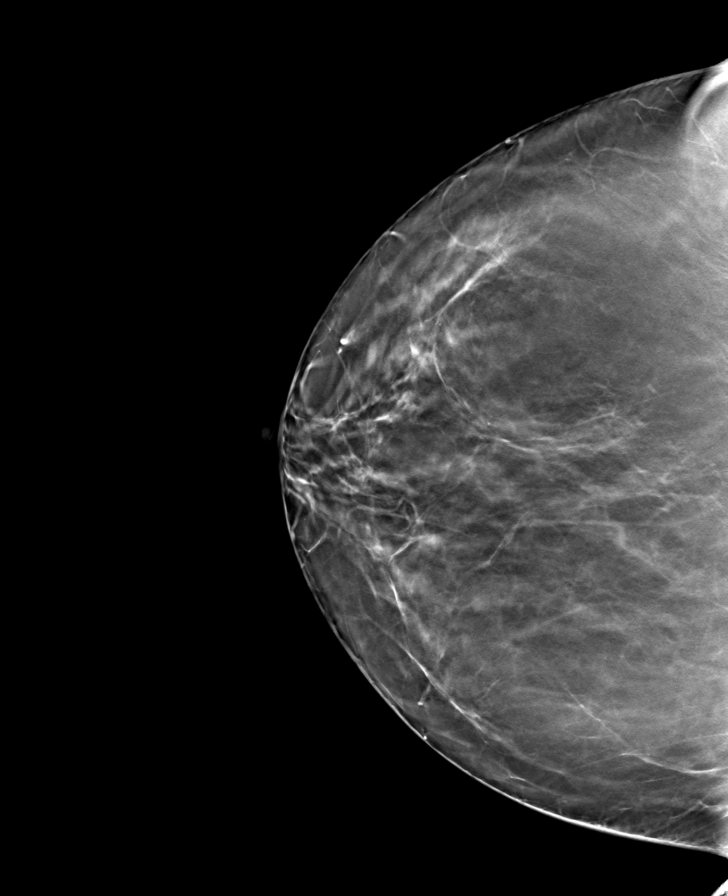

[8 of 24 positions shown; findings below may reference images not displayed]

ACR Breast Density Category b: There are scattered areas of
fibroglandular density.
FINDINGS: There are no findings suspicious for malignancy.
IMPRESSION: No mammographic evidence of malignancy. A result letter of this
screening mammogram will be mailed directly to the patient.

RECOMMENDATION:
Screening mammogram in one year. (Code:51-O-LD2)

BI-RADS CATEGORY  1: Negative.

## 2021-10-10 ENCOUNTER — Ambulatory Visit: Payer: BC Managed Care – PPO | Admitting: Cardiology

## 2021-10-10 ENCOUNTER — Other Ambulatory Visit: Payer: Self-pay

## 2021-10-10 ENCOUNTER — Ambulatory Visit (INDEPENDENT_AMBULATORY_CARE_PROVIDER_SITE_OTHER): Payer: BC Managed Care – PPO

## 2021-10-10 ENCOUNTER — Encounter: Payer: Self-pay | Admitting: *Deleted

## 2021-10-10 ENCOUNTER — Encounter: Payer: Self-pay | Admitting: Cardiology

## 2021-10-10 VITALS — BP 140/80 | HR 91 | Ht 62.0 in | Wt 206.0 lb

## 2021-10-10 DIAGNOSIS — R002 Palpitations: Secondary | ICD-10-CM | POA: Diagnosis not present

## 2021-10-10 DIAGNOSIS — E782 Mixed hyperlipidemia: Secondary | ICD-10-CM

## 2021-10-10 DIAGNOSIS — I10 Essential (primary) hypertension: Secondary | ICD-10-CM | POA: Diagnosis not present

## 2021-10-10 DIAGNOSIS — R079 Chest pain, unspecified: Secondary | ICD-10-CM

## 2021-10-10 NOTE — Patient Instructions (Signed)
Medication Instructions:  The current medical regimen is effective;  continue present plan and medications.  *If you need a refill on your cardiac medications before your next appointment, please call your pharmacy*  Testing/Procedures: Your physician has requested that you have an echocardiogram. Echocardiography is a painless test that uses sound waves to create images of your heart. It provides your doctor with information about the size and shape of your heart and how well your hearts chambers and valves are working. This procedure takes approximately one hour. There are no restrictions for this procedure.  Your physician has requested that you have a myoview. For further information please visit [email or web address]. Please follow instruction sheet, as given.  ZIO XT- Long Term Monitor Instructions  Your physician has requested you wear a ZIO patch monitor for 14 days.  This is a single patch monitor. [employer] supplies one patch monitor per enrollment. Additional stickers are not available. Please do not apply patch if you will be having a Nuclear Stress Test,  Echocardiogram, Cardiac CT, MRI, or Chest Xray during the period you would be wearing the  monitor. The patch cannot be worn during these tests. You cannot remove and re-apply the  ZIO XT patch monitor.  Your ZIO patch monitor will be mailed 3 day USPS to your address on file. It may take 3-5 days  to receive your monitor after you have been enrolled.  Once you have received your monitor, please review the enclosed instructions. Your monitor  has already been registered assigning a specific monitor serial # to you.  Billing and Patient Assistance Program Information  We have supplied [employer] with any of your insurance information on file for billing purposes. [employer] offers a sliding scale Patient Assistance Program for patients that do not have  insurance, or whose insurance does not completely cover the cost of the ZIO  monitor.  You must apply for the Patient Assistance Program to qualify for this discounted rate.  To apply, please call [employer] at [phone], select option 4, select option 2, ask to apply for  Patient Assistance Program. Theodore Demark will ask your household income, and how many people  are in your household. They will quote your out-of-pocket cost based on that information.  [employer] will also be able to set up a 26-month, interest-free payment plan if needed.  Applying the monitor   Shave hair from upper left chest.  Hold abrader disc by orange tab. Rub abrader in 40 strokes over the upper left chest as  indicated in your monitor instructions.  Clean area with 4 enclosed alcohol pads. Let dry.  Apply patch as indicated in monitor instructions. Patch will be placed under collarbone on left  side of chest with arrow pointing upward.  Rub patch adhesive wings for 2 minutes. Remove white label marked "1". Remove the white  label marked "2". Rub patch adhesive wings for 2 additional minutes.  While looking in a mirror, press and release button in center of patch. A small green light will  flash 3-4 times. This will be your only indicator that the monitor has been turned on.  Do not shower for the first 24 hours. You may shower after the first 24 hours.  Press the button if you feel a symptom. You will hear a small click. Record Date, Time and  Symptom in the Patient Logbook.  When you are ready to remove the patch, follow instructions on the last 2 pages of Patient  Logbook. Stick patch monitor onto the last  page of Patient Logbook.  Place Patient Logbook in the blue and white box. Use locking tab on box and tape box closed  securely. The blue and white box has prepaid postage on it. Please place it in the mailbox as  soon as possible. Your physician should have your test results approximately 7 days after the  monitor has been mailed back to [facility].  Call [facility] at  [phone] if you have questions regarding  your ZIO XT patch monitor. Call them immediately if you see an orange light blinking on your  monitor.  If your monitor falls off in less than 4 days, contact our Monitor department at [phone].  If your monitor becomes loose or falls off after 4 days call [employer] at [phone] for  suggestions on securing your monitor  Follow-Up: At [facility], you and your health needs are our priority.  As part of our continuing mission to provide you with exceptional heart care, we have created designated Provider Care Teams.  These Care Teams include your primary Cardiologist (physician) and Advanced Practice Providers (APPs -  Physician Assistants and Nurse Practitioners) who all work together to provide you with the care you need, when you need it.  We recommend signing up for the patient portal called "MyChart".  Sign up information is provided on this After Visit Summary.  MyChart is used to connect with patients for Virtual Visits (Telemedicine).  Patients are able to view lab/test results, encounter notes, upcoming appointments, etc.  Non-urgent messages can be sent to your provider as well.   To learn more about what you can do with MyChart, go to [email or web address].    Your next appointment:   6 month(s)  The format for your next appointment:   In Person  Provider:   Dr Candee Furbish  If MD is not listed, click here to update    :1}    Thank you for choosing [facility]!!

## 2021-10-10 NOTE — Assessment & Plan Note (Signed)
Currently on enalapril 20 mg.  No changes made.  Continue with current medical management.

## 2021-10-10 NOTE — Assessment & Plan Note (Signed)
Agree that this could be potentially musculoskeletal however her sister has hypertrophic cardiomyopathy and I think it would be valuable for [place] to reevaluate with echocardiogram to see if there is any phenotypic evidence of this.  I will also obtain a treadmill nuclear stress test to evaluate for any possible ischemic changes.  At some point it may be valuable to perform a cardiac MRI if HCM diagnosis is equivocal.  Her heart rate is 91 bpm, I think this would be challenging to decrease low enough for CT scan of coronaries.

## 2021-10-10 NOTE — Assessment & Plan Note (Signed)
Continue with pravastatin 

## 2021-10-10 NOTE — Assessment & Plan Note (Signed)
I will go ahead and check a Zio patch monitor to exclude any adverse arrhythmias.  She does have a PVC noted on her EKG.

## 2021-10-10 NOTE — Progress Notes (Signed)
Cardiology Office Note:    Date:  10/10/2021   ID:  Stacey Turner, DOB July 08, 1961, MRN [id number]  PCP:  [name], MD   [facility] Providers Cardiologist:  Candee Furbish, MD     Referring MD: [name], MD    History of Present Illness:    Stacey Turner is a 61 y.o. female here for the evaluation of chest pain at the request of Dr. [name].  In review of office note from Dr. Dema Severin, she was describing chest discomfort and was seen in the emergency room in August 2022.  Chest x-ray EKG were all normal.  PVCs were noted.  She describes waking up with severe sharp pain pressure in the substernal area.  Perhaps she was anxious.  Her left arm felt numb.  Pain was a 7/10.  I previously seen her in 2015 for an abnormal echo with basal septal hypertrophy.  Had previously worn an event monitor that was normal.  Take care of her [place], has hypertrophic cardiomyopathy.  She has rheumatoid arthritis.  Today she still feels some discomfort at times in her chest wall, pressure-like.  She also feels skipped beats.  She has not had any syncope.  Past Medical History:  Diagnosis Date   Anxiety    Cardiac murmur    Class 2 severe obesity due to excess calories with serious comorbidity in adult, unspecified BMI (HCC)    Elevated white blood cell count    Fibroid uterus 2001   Fibroids    Grief    H. 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   Hyperlipidemia    Hypertension    Irregular periods/menstrual cycles 1999   LVH (left ventricular hypertrophy) due to hypertensive disease    Monilial vaginitis 2003   Palpitations    Pelvic pain in female 11/25/2011   PONV (postoperative nausea and vomiting)    Rheumatoid arthritis ([place])    Seasonal allergic rhinitis    Sinusitis 2009   Situational stress    Stress 2004    Vitamin D deficiency    Yeast vaginitis 2004    Past Surgical History:  Procedure Laterality Date   BREAST BIOPSY Left 2016   BUNIONECTOMY     GANGLION CYST EXCISION     MYOMECTOMY  12/09/2011   Procedure: MYOMECTOMY;  Surgeon: Ena Dawley, MD;  Location: [facility];  Service: Gynecology;  Laterality: N/A;  Abdominal Myomectomy, Biopsy abdominal Myometrium    Current Medications: Current Meds  Medication Sig   enalapril (VASOTEC) 20 MG tablet Take 20 mg by mouth daily.   fluticasone (FLONASE) 50 MCG/ACT nasal spray Place into both nostrils every other day.   hydroxychloroquine (PLAQUENIL) 200 MG tablet TAKE 1 TABLET TWICE A DAY MONDAY THROUGH FRIDAY ONLY   montelukast (SINGULAIR) 10 MG tablet Take 10 mg by mouth at bedtime.   pravastatin (PRAVACHOL) 40 MG tablet Take 40 mg by mouth every morning.   triamcinolone (NASACORT) 55 MCG/ACT nasal inhaler Place 1 spray into the nose daily. Alternates with Astelin   triamcinolone cream (KENALOG) 0.5 % APPLY SPARINGLY TO AFFECTED AREA TWICE A DAY EXTERNALLY 14 DAYS   Vitamin D, Ergocalciferol, (DRISDOL) 1.25 MG (50000 UNIT) CAPS capsule Take 1 capsule (50,000 Units total) by mouth every 7 (seven) days.     Allergies:  Latex and Penicillins   Social History   Socioeconomic History   Marital status: Single    Spouse name: Not on file   Number of children: Not on file   Years of education: Not on file   Highest education level: Not on file  Occupational History   Not on file  Tobacco Use   Smoking status: Never   Smokeless tobacco: Never  Vaping Use   Vaping Use: Never used  Substance and Sexual Activity   Alcohol use: No   Drug use: No   Sexual activity: Yes  Other Topics Concern   Not on file  Social History Narrative   Not on file   Social Determinants of Health   Financial Resource Strain: Not on file  Food Insecurity: Not on file  Transportation Needs: Not on file  Physical Activity: Not on file  Stress: Not on file   Social Connections: Not on file     Family History: The patient's family history includes Cancer in her father; Diabetes in her mother; Heart Problems in her sister; Heart failure in her mother; Hyperlipidemia in her father, mother, and sister; Hypertension in her brother, father, mother, and sister; Hypertrophic cardiomyopathy in her sister. There is no history of Breast cancer.  ROS:   Please see the history of present illness.     All other systems reviewed and are negative.  EKGs/Labs/Other Studies Reviewed:    The following studies were reviewed today: Echocardiogram 08/22/2013:  - Left ventricle: The cavity size was normal. There was    severe focal basal and mild concentric hypertrophy.    Systolic function was normal. The estimated ejection    fraction was 55%. Wall motion was normal; there were no    regional wall motion abnormalities.  - Mitral valve: Mild regurgitation.  - Left atrium: The atrium was mildly dilated.  - Pulmonary arteries: PA peak pressure: 3mm Hg (S).  Impressions:   - The right ventricular systolic pressure was increased    consistent with mild pulmonary hypertension.   EKG:  EKG is  ordered today.  The ekg ordered today demonstrates sinus rhythm 91 with nonspecific ST-T wave changes PVC noted  Recent Labs: 01/22/2021: ALT 19 05/17/2021: BUN 13; Creatinine, Ser 0.74; Hemoglobin 11.6; Platelets 243; Potassium 3.1; Sodium 141  Recent Lipid Panel No results found for: CHOL, TRIG, HDL, CHOLHDL, VLDL, LDLCALC, LDLDIRECT   Risk Assessment/Calculations:              Physical Exam:    VS:  BP 140/80 (BP Location: Left Arm, Patient Position: Sitting, Cuff Size: Normal)    Pulse 91    Ht 5\' 2"  (1.575 m)    Wt 206 lb (93.4 kg)    LMP 12/27/2013    BMI 37.68 kg/m     Wt Readings from Last 3 Encounters:  10/10/21 206 lb (93.4 kg)  06/25/21 208 lb (94.3 kg)  05/17/21 205 lb (93 kg)     GEN:  Well nourished, well developed in no acute  distress HEENT: Normal NECK: No JVD; No carotid bruits LYMPHATICS: No lymphadenopathy CARDIAC: RRR, no murmurs, no rubs, gallops RESPIRATORY:  Clear to auscultation without rales, wheezing or rhonchi  ABDOMEN: Soft, non-tender, non-distended MUSCULOSKELETAL:  No edema; No deformity  SKIN: Warm and dry NEUROLOGIC:  Alert and oriented x 3 PSYCHIATRIC:  Normal affect   ASSESSMENT:    1. Essential hypertension   2. Palpitations   3. Chest pain of uncertain etiology   4. Mixed  hyperlipidemia    PLAN:    In order of problems listed above:  Chest pain of uncertain etiology Agree that this could be potentially musculoskeletal however her sister has hypertrophic cardiomyopathy and I think it would be valuable for [place] to reevaluate with echocardiogram to see if there is any phenotypic evidence of this.  I will also obtain a treadmill nuclear stress test to evaluate for any possible ischemic changes.  At some point it may be valuable to perform a cardiac MRI if HCM diagnosis is equivocal.  Her heart rate is 91 bpm, I think this would be challenging to decrease low enough for CT scan of coronaries.  Palpitations I will go ahead and check a Zio patch monitor to exclude any adverse arrhythmias.  She does have a PVC noted on her EKG.  Hyperlipidemia Continue with pravastatin.  Essential hypertension Currently on enalapril 20 mg.  No changes made.  Continue with current medical management.         Medication Adjustments/Labs and Tests Ordered: Current medicines are reviewed at length with the patient today.  Concerns regarding medicines are outlined above.  Orders Placed This Encounter  Procedures   Cardiac Stress Test: Informed Consent Details: Physician/Practitioner Attestation; Transcribe to consent form and obtain patient signature   MYOCARDIAL PERFUSION IMAGING   LONG TERM MONITOR (3-14 DAYS)   EKG 12-Lead   ECHOCARDIOGRAM COMPLETE   No orders of the defined types were placed in  this encounter.   Patient Instructions  Medication Instructions:  The current medical regimen is effective;  continue present plan and medications.  *If you need a refill on your cardiac medications before your next appointment, please call your pharmacy*  Testing/Procedures: Your physician has requested that you have an echocardiogram. Echocardiography is a painless test that uses sound waves to create images of your heart. It provides your doctor with information about the size and shape of your heart and how well your hearts chambers and valves are working. This procedure takes approximately one hour. There are no restrictions for this procedure.  Your physician has requested that you have a myoview. For further information please visit [email or web address]. Please follow instruction sheet, as given.  ZIO XT- Long Term Monitor Instructions  Your physician has requested you wear a ZIO patch monitor for 14 days.  This is a single patch monitor. [employer] supplies one patch monitor per enrollment. Additional stickers are not available. Please do not apply patch if you will be having a Nuclear Stress Test,  Echocardiogram, Cardiac CT, MRI, or Chest Xray during the period you would be wearing the  monitor. The patch cannot be worn during these tests. You cannot remove and re-apply the  ZIO XT patch monitor.  Your ZIO patch monitor will be mailed 3 day USPS to your address on file. It may take 3-5 days  to receive your monitor after you have been enrolled.  Once you have received your monitor, please review the enclosed instructions. Your monitor  has already been registered assigning a specific monitor serial # to you.  Billing and Patient Assistance Program Information  We have supplied [employer] with any of your insurance information on file for billing purposes. [employer] offers a sliding scale Patient Assistance Program for patients that do not have  insurance, or whose insurance does not  completely cover the cost of the ZIO monitor.  You must apply for the Patient Assistance Program to qualify for this discounted rate.  To apply, please call [employer] at [phone],  select option 4, select option 2, ask to apply for  Patient Assistance Program. Theodore Demark will ask your household income, and how many people  are in your household. They will quote your out-of-pocket cost based on that information.  [employer] will also be able to set up a 72-month, interest-free payment plan if needed.  Applying the monitor   Shave hair from upper left chest.  Hold abrader disc by orange tab. Rub abrader in 40 strokes over the upper left chest as  indicated in your monitor instructions.  Clean area with 4 enclosed alcohol pads. Let dry.  Apply patch as indicated in monitor instructions. Patch will be placed under collarbone on left  side of chest with arrow pointing upward.  Rub patch adhesive wings for 2 minutes. Remove white label marked "1". Remove the white  label marked "2". Rub patch adhesive wings for 2 additional minutes.  While looking in a mirror, press and release button in center of patch. A small green light will  flash 3-4 times. This will be your only indicator that the monitor has been turned on.  Do not shower for the first 24 hours. You may shower after the first 24 hours.  Press the button if you feel a symptom. You will hear a small click. Record Date, Time and  Symptom in the Patient Logbook.  When you are ready to remove the patch, follow instructions on the last 2 pages of Patient  Logbook. Stick patch monitor onto the last page of Patient Logbook.  Place Patient Logbook in the blue and white box. Use locking tab on box and tape box closed  securely. The blue and white box has prepaid postage on it. Please place it in the mailbox as  soon as possible. Your physician should have your test results approximately 7 days after the  monitor has been mailed back to [facility].  Call  [place] at [phone] if you have questions regarding  your ZIO XT patch monitor. Call them immediately if you see an orange light blinking on your  monitor.  If your monitor falls off in less than 4 days, contact our Monitor department at [phone].  If your monitor becomes loose or falls off after 4 days call [employer] at [phone] for  suggestions on securing your monitor  Follow-Up: At [facility], you and your health needs are our priority.  As part of our continuing mission to provide you with exceptional heart care, we have created designated Provider Care Teams.  These Care Teams include your primary Cardiologist (physician) and Advanced Practice Providers (APPs -  Physician Assistants and Nurse Practitioners) who all work together to provide you with the care you need, when you need it.  We recommend signing up for the patient portal called "MyChart".  Sign up information is provided on this After Visit Summary.  MyChart is used to connect with patients for Virtual Visits (Telemedicine).  Patients are able to view lab/test results, encounter notes, upcoming appointments, etc.  Non-urgent messages can be sent to your provider as well.   To learn more about what you can do with MyChart, go to [email or web address].    Your next appointment:   6 month(s)  The format for your next appointment:   In Person  Provider:   Dr Candee Furbish  If MD is not listed, click here to update    :1}    Thank you for choosing [facility]!!      Signed, Elta Guadeloupe  Marlou Porch, MD  10/10/2021 2:57 PM    [facility]

## 2021-10-10 NOTE — Progress Notes (Unsigned)
Enrolled patient for a 14 day Zio XT  monitor to be mailed to patients home  °

## 2021-10-23 ENCOUNTER — Telehealth: Payer: Self-pay | Admitting: Rheumatology

## 2021-10-23 DIAGNOSIS — Z79899 Other long term (current) drug therapy: Secondary | ICD-10-CM

## 2021-10-23 NOTE — Telephone Encounter (Signed)
Patient called the office requesting her lab orders be sent to Dr. Dema Severin with [facility]. Patient states she has an appointment at 8:15 tomorrow and would like to get everything doe there.

## 2021-10-23 NOTE — Telephone Encounter (Signed)
Lab Orders released and faxed.  °

## 2021-10-24 DIAGNOSIS — J301 Allergic rhinitis due to pollen: Secondary | ICD-10-CM | POA: Diagnosis not present

## 2021-10-24 DIAGNOSIS — E785 Hyperlipidemia, unspecified: Secondary | ICD-10-CM | POA: Diagnosis not present

## 2021-10-24 DIAGNOSIS — I10 Essential (primary) hypertension: Secondary | ICD-10-CM | POA: Diagnosis not present

## 2021-10-24 DIAGNOSIS — E559 Vitamin D deficiency, unspecified: Secondary | ICD-10-CM | POA: Diagnosis not present

## 2021-10-28 ENCOUNTER — Telehealth: Payer: Self-pay | Admitting: *Deleted

## 2021-10-28 ENCOUNTER — Telehealth (HOSPITAL_COMMUNITY): Payer: Self-pay

## 2021-10-28 NOTE — Telephone Encounter (Signed)
Detailed instructions left on the patient's answering machine. Asked to call back with any questions. S.Zeniah Briney EMTP 

## 2021-10-28 NOTE — Telephone Encounter (Signed)
Labs received from:[facility]  Drawn on:10/24/2021  Reviewed by:Hazel Sams, PA-C  Labs drawn:Vitamin D, TSH, CBC, CMP, Lipid Panel  Results:Vitamin D: 17.9   WBC 3.9   RBC 4.17   Hgb 11.1   Hct 33.9   MCH 26.7  Patient on PLQ one tablet twice a day Monday-Friday.

## 2021-10-30 ENCOUNTER — Ambulatory Visit (HOSPITAL_COMMUNITY): Payer: BC Managed Care – PPO | Attending: Cardiology

## 2021-10-30 ENCOUNTER — Ambulatory Visit (HOSPITAL_BASED_OUTPATIENT_CLINIC_OR_DEPARTMENT_OTHER): Payer: BC Managed Care – PPO

## 2021-10-30 ENCOUNTER — Other Ambulatory Visit: Payer: Self-pay

## 2021-10-30 DIAGNOSIS — R079 Chest pain, unspecified: Secondary | ICD-10-CM

## 2021-10-30 DIAGNOSIS — I10 Essential (primary) hypertension: Secondary | ICD-10-CM

## 2021-10-30 DIAGNOSIS — R002 Palpitations: Secondary | ICD-10-CM

## 2021-10-30 LAB — ECHOCARDIOGRAM COMPLETE
Area-P 1/2: 6.17 cm2
S' Lateral: 2.2 cm

## 2021-10-30 LAB — MYOCARDIAL PERFUSION IMAGING
LV dias vol: 77 mL (ref 46–106)
LV sys vol: 39 mL
Nuc Stress EF: 49 %
Peak HR: 118 {beats}/min
Rest HR: 72 {beats}/min
Rest Nuclear Isotope Dose: 10.4 mCi
SDS: 0
SRS: 0
SSS: 0
ST Depression (mm): 0 mm
Stress Nuclear Isotope Dose: 31.2 mCi
TID: 0.99

## 2021-10-30 MED ORDER — REGADENOSON 0.4 MG/5ML IV SOLN
0.4000 mg | Freq: Once | INTRAVENOUS | Status: AC
  Administered 2021-10-30: 0.4 mg via INTRAVENOUS

## 2021-10-30 MED ORDER — TECHNETIUM TC 99M TETROFOSMIN IV KIT
31.2000 | PACK | Freq: Once | INTRAVENOUS | Status: AC | PRN
  Administered 2021-10-30: 31.2 via INTRAVENOUS
  Filled 2021-10-30: qty 32

## 2021-10-30 MED ORDER — TECHNETIUM TC 99M TETROFOSMIN IV KIT
10.4000 | PACK | Freq: Once | INTRAVENOUS | Status: AC | PRN
  Administered 2021-10-30: 10.4 via INTRAVENOUS
  Filled 2021-10-30: qty 11

## 2021-11-05 ENCOUNTER — Encounter: Payer: Self-pay | Admitting: *Deleted

## 2021-11-05 DIAGNOSIS — R002 Palpitations: Secondary | ICD-10-CM

## 2021-11-05 DIAGNOSIS — R079 Chest pain, unspecified: Secondary | ICD-10-CM | POA: Diagnosis not present

## 2021-11-07 NOTE — Progress Notes (Addendum)
Office Visit Note  Patient: Stacey Turner             Date of Birth: 11-14-1960           MRN: [id number]             PCP: [name], MD Referring: [name], MD Visit Date: 11/21/2021 Occupation: @GUAROCC @  Subjective:  Medication management  History of Present Illness: Stacey Turner is a 61 y.o. female with history of seronegative rheumatoid arthritis and osteoarthritis.  She has been taking hydroxychloroquine on a regular basis.  She has been working from home and has to sit for long hours.  She states after prolonged sitting she notices a stiffness in her knee joints.  There is no history of joint swelling.  Still has problems with trochanteric bursitis.  Patient states that she had labs at her PCPs office last month.  She was found to have vitamin D deficiency.  Her vitamin D dose was increased.  He had an episode of chest pain and fluttering.  She was seen by the cardiologist.  She has a follow-up appointment with the cardiologist to discuss results.  Activities of Daily Living:  Patient reports morning stiffness for 54minute.   Patient Denies nocturnal pain.  Difficulty dressing/grooming: Denies Difficulty climbing stairs: Denies Difficulty getting out of chair: Denies Difficulty using hands for taps, buttons, cutlery, and/or writing: Denies  Review of Systems  Constitutional:  Negative for night sweats, weight gain and weight loss.  HENT:  Negative for mouth sores, trouble swallowing, trouble swallowing, mouth dryness and nose dryness.   Eyes:  Negative for pain, redness, visual disturbance and dryness.  Respiratory:  Negative for cough, shortness of breath and difficulty breathing.   Cardiovascular:  Negative for chest pain, palpitations, hypertension, irregular heartbeat and swelling in legs/feet.  Gastrointestinal:  Negative for blood in stool, constipation and diarrhea.  Endocrine: Negative for increased urination.  Genitourinary:  Negative for vaginal dryness.   Musculoskeletal:  Positive for joint pain and joint pain. Negative for joint swelling, myalgias, muscle weakness, morning stiffness, muscle tenderness and myalgias.  Skin:  Negative for color change, rash, hair loss, skin tightness, ulcers and sensitivity to sunlight.  Allergic/Immunologic: Negative for susceptible to infections.  Neurological:  Negative for dizziness, memory loss, night sweats and weakness.  Hematological:  Negative for swollen glands.  Psychiatric/Behavioral:  Negative for depressed mood and sleep disturbance. The patient is not nervous/anxious.    PMFS History:  Patient Active Problem List   Diagnosis Date Noted   Chest pain of uncertain etiology 85/46/2703   Primary insomnia 01/16/2017   Rheumatoid arthritis of multiple sites with negative rheumatoid factor ([place]) 01/05/2017   High risk medication use 01/05/2017   Trochanteric bursitis of both hips 01/05/2017   Palpitations 10/06/2013   Cardiac murmur 10/06/2013   Essential hypertension 10/06/2013   Hyperlipidemia 10/06/2013   S/P myomectomy 03/14/2012   Obesity 01/19/2012   Fibroids 01/19/2012   Pelvic pain 01/19/2012    Past Medical History:  Diagnosis Date   Anxiety    Cardiac murmur    Class 2 severe obesity due to excess calories with serious comorbidity in adult, unspecified BMI ([place])    Elevated white blood cell count    Fibroid uterus 2001   Fibroids    Grief    H. 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   Hyperlipidemia    Hypertension    Irregular periods/menstrual cycles 1999   LVH (left ventricular hypertrophy) due to hypertensive disease    Monilial vaginitis 2003   Palpitations    Pelvic pain in female 11/25/2011   PONV (postoperative nausea and vomiting)    Rheumatoid arthritis ([place])    Seasonal allergic rhinitis    Sinusitis  2009   Situational stress    Stress 2004   Vitamin D deficiency    Yeast vaginitis 2004    Family History  Problem Relation Age of Onset   Hyperlipidemia Mother    Hypertension Mother    Heart failure Mother    Diabetes Mother    Hyperlipidemia Father    Hypertension Father    Cancer Father        Colon   Hypertension Sister    Hyperlipidemia Sister    Heart Problems Sister    Hypertrophic cardiomyopathy Sister    Hypertension Brother    Breast cancer Neg Hx    Past Surgical History:  Procedure Laterality Date   BREAST BIOPSY Left 2016   BUNIONECTOMY     GANGLION CYST EXCISION     MYOMECTOMY  12/09/2011   Procedure: MYOMECTOMY;  Surgeon: Ena Dawley, MD;  Location: [facility];  Service: Gynecology;  Laterality: N/A;  Abdominal Myomectomy, Biopsy abdominal Myometrium   Social History   Social History Narrative   Not on file   Immunization History  Administered Date(s) Administered   PFIZER(Purple Top)SARS-COV-2 Vaccination 05/16/2020, 06/06/2020     Objective: Vital Signs: BP 131/81 (BP Location: Left Arm, Patient Position: Sitting, Cuff Size: Large)    Pulse 87    Ht 5\' 2"  (1.575 m)    Wt 206 lb 6.4 oz (93.6 kg)    LMP 12/27/2013    BMI 37.75 kg/m    Physical Exam Vitals and nursing note reviewed.  Constitutional:      Appearance: She is well-developed.  HENT:     Head: Normocephalic and atraumatic.  Eyes:     Conjunctiva/sclera: Conjunctivae normal.  Cardiovascular:     Rate and Rhythm: Normal rate and regular rhythm.     Heart sounds: Normal heart sounds.  Pulmonary:     Effort: Pulmonary effort is normal.     Breath sounds: Normal breath sounds.  Abdominal:     General: Bowel sounds are normal.     Palpations: Abdomen is soft.  Musculoskeletal:     Cervical back: Normal range of motion.  Lymphadenopathy:     Cervical: No cervical adenopathy.  Skin:    General: Skin is warm and dry.     Capillary Refill: Capillary refill takes less than 2 seconds.   Neurological:     Mental Status: She is alert and oriented to person, place, and time.  Psychiatric:        Behavior: Behavior normal.     Musculoskeletal Exam: C-spine range of motion.  Shoulder joints, elbow joints, wrist joints, MCPs PIPs and DIPs with good range of motion with no synovitis.  Hip joints, knee joints, ankles with good range of motion.  She right first MTP bunion was noted.  There was no tenderness over MTPs.  CDAI Exam: CDAI Score: 0.4  Patient Global: 2 mm; Provider Global: 2 mm Swollen: 0 ; Tender: 0  Joint Exam 11/21/2021   No joint exam has been documented for this visit   There is currently no information documented on the  homunculus. Go to the Rheumatology activity and complete the homunculus joint exam.  Investigation: No additional findings.  Imaging: MYOCARDIAL PERFUSION IMAGING  Result Date: 10/30/2021   The study is normal. The study is low risk.   No ST deviation was noted.   Left ventricular function is abnormal. Global function is mildly reduced. Nuclear stress EF: 49 %. The left ventricular ejection fraction is mildly decreased (45-54%). Recommend TTE correlation. End diastolic cavity size is normal.   Prior study not available for comparison.   ECHOCARDIOGRAM COMPLETE  Result Date: 10/30/2021    ECHOCARDIOGRAM REPORT   Patient Name:   Stacey Turner Date of Exam: 10/30/2021 Medical Rec #:  [id number]     Height:       62.0 in Accession #:    [id number]    Weight:       206.0 lb Date of Birth:  12-06-1960     BSA:          1.936 m Patient Age:    45 years      BP:           141/80 mmHg Patient Gender: F             HR:           89 bpm. Exam Location:  [address] Procedure: 2D Echo, Cardiac Doppler and Color Doppler Indications:    R07.9 Chest pain  History:        Patient has no prior history of Echocardiogram examinations.                 Signs/Symptoms:Murmur; Risk Factors:Hypertension and                 Dyslipidemia.  Sonographer:    Wilford Sports  Rodgers-Jones RDCS Referring Phys: [id number] MARK C SKAINS IMPRESSIONS  1. Left ventricular ejection fraction, by estimation, is 60 to 65%. The left ventricle has normal function. The left ventricle has no regional wall motion abnormalities. There is mild concentric left ventricular hypertrophy and severe basal septal hypertrophy.  2. Right ventricular systolic function is normal. The right ventricular size is normal. There is normal pulmonary artery systolic pressure. The estimated right ventricular systolic pressure is 54.6 mmHg.  3. The mitral valve is normal in structure. Mild mitral valve regurgitation. No evidence of mitral stenosis.  4. The aortic valve is normal in structure. Aortic valve regurgitation is not visualized. No aortic stenosis is present.  5. The inferior vena cava is normal in size with greater than 50% respiratory variability, suggesting right atrial pressure of 3 mmHg.  6. Left atrial size was mildly dilated. FINDINGS  Left Ventricle: Left ventricular ejection fraction, by estimation, is 60 to 65%. The left ventricle has normal function. The left ventricle has no regional wall motion abnormalities. The left ventricular internal cavity size was normal in size. There is  mild concentric left ventricular hypertrophy. Left ventricular diastolic parameters are consistent with Grade II diastolic dysfunction (pseudonormalization). Normal left ventricular filling pressure. Right Ventricle: The right ventricular size is normal. No increase in right ventricular wall thickness. Right ventricular systolic function is normal. There is normal pulmonary artery systolic pressure. The tricuspid regurgitant velocity is 2.61 m/s, and  with an assumed right atrial pressure of 3 mmHg, the estimated right ventricular systolic pressure is 50.3 mmHg. Left Atrium: Left atrial size was mildly dilated. Right Atrium: Right atrial size was normal in size. Pericardium: There is no evidence of pericardial effusion. Mitral  Valve: The mitral  valve is normal in structure. Mild mitral valve regurgitation. No evidence of mitral valve stenosis. Tricuspid Valve: The tricuspid valve is normal in structure. Tricuspid valve regurgitation is mild . No evidence of tricuspid stenosis. Aortic Valve: The aortic valve is normal in structure. Aortic valve regurgitation is not visualized. No aortic stenosis is present. Pulmonic Valve: The pulmonic valve was normal in structure. Pulmonic valve regurgitation is mild. No evidence of pulmonic stenosis. Aorta: The aortic root is normal in size and structure. Venous: The inferior vena cava is normal in size with greater than 50% respiratory variability, suggesting right atrial pressure of 3 mmHg. IAS/Shunts: No atrial level shunt detected by color flow Doppler.  LEFT VENTRICLE PLAX 2D LVIDd:         3.40 cm   Diastology LVIDs:         2.20 cm   LV e' medial:    7.40 cm/s LV PW:         1.20 cm   LV E/e' medial:  17.8 LV IVS:        1.60 cm   LV e' lateral:   15.00 cm/s LVOT diam:     1.70 cm   LV E/e' lateral: 8.8 LV SV:         39 LV SV Index:   20 LVOT Area:     2.27 cm  RIGHT VENTRICLE RV Basal diam:  3.70 cm RV S prime:     15.53 cm/s TAPSE (M-mode): 2.6 cm LEFT ATRIUM              Index        RIGHT ATRIUM           Index LA diam:        5.10 cm  2.63 cm/m   RA Area:     12.60 cm LA Vol (A2C):   57.1 ml  29.50 ml/m  RA Volume:   32.30 ml  16.69 ml/m LA Vol (A4C):   103.0 ml 53.21 ml/m LA Biplane Vol: 76.9 ml  39.72 ml/m  AORTIC VALVE LVOT Vmax:   93.20 cm/s LVOT Vmean:  61.550 cm/s LVOT VTI:    0.174 m  AORTA Ao Root diam: 2.70 cm Ao Asc diam:  3.40 cm MITRAL VALVE                TRICUSPID VALVE MV Area (PHT): 6.17 cm     TR Peak grad:   27.2 mmHg MV Decel Time: 123 msec     TR Vmax:        261.00 cm/s MV E velocity: 131.50 cm/s MV A velocity: 93.70 cm/s   SHUNTS MV E/A ratio:  1.40         Systemic VTI:  0.17 m                             Systemic Diam: 1.70 cm Fransico Him MD Electronically  signed by Fransico Him MD Signature Date/Time: 10/30/2021/11:20:32 AM    Final     Recent Labs: Lab Results  Component Value Date   WBC 6.5 05/17/2021   HGB 11.6 (L) 05/17/2021   PLT 243 05/17/2021   NA 141 05/17/2021   K 3.1 (L) 05/17/2021   CL 105 05/17/2021   CO2 25 05/17/2021   GLUCOSE 116 (H) 05/17/2021   BUN 13 05/17/2021   CREATININE 0.74 05/17/2021   BILITOT 0.3 01/22/2021   ALKPHOS 49 01/19/2017  AST 15 01/22/2021   ALT 19 01/22/2021   PROT 6.9 01/22/2021   ALBUMIN 4.2 01/19/2017   CALCIUM 9.1 05/17/2021   GFRAA 104 01/22/2021      Speciality Comments: PLQ Eye Exam: 07/22/2021 [facility] Follow up in 1 year    Procedures:  No procedures performed Allergies: Latex and Penicillins   Assessment / Plan:     Visit Diagnoses: Rheumatoid arthritis of multiple sites with negative rheumatoid factor (HCC)-she had no synovitis on examination.  She has been doing well on hydroxychloroquine.  She has some stiffness after prolonged sitting.  High risk medication use - PLQ 200 mg BID M-F.  PLQ Eye Exam: 07/22/2021.  Patient states she had labs at Dr. Orest Dikes office recently.  We Stacey get lab results.  She has been advised to get labs every 5 months which should include CBC with differential and CMP with GFR.  Information on immunization was placed in the AVS.  She has been getting annual eye examination to monitor for ocular toxicity. Addendum: Labs obtained from Dr. Orest Dikes office were reviewed October 24, 2021 TSH normal, CBC WBC 3.9, hemoglobin 11.1, platelets 199, CMP within normal limits, vitamin D 17.9, LDL 76.  Primary osteoarthritis of both hands-she has some stiffness from osteoarthritis.  No synovitis was noted.  Trochanteric bursitis of both hips-she is decreased to have some trochanteric bursa tenderness.  She was advised to continue IT band stretches.  Primary osteoarthritis of both feet-she had surgery on her right first MTP joint she still has a bunion  which is painful.  She plans to schedule an appointment with the podiatrist.  Proper fitting shoes with arch support were discussed.  Vitamin D deficiency - DEXA scan on September 19, 2019 was within normal limits.  Patient states that she was recently diagnosed with vitamin D deficiency and she has been taking vitamin D as prescribed by Dr. Dema Severin.  Other fatigue-improved.  Primary insomnia-good sleep hygiene was discussed.  History of hypertension-blood pressure was normal today.  Mixed hyperlipidemia-her BMI is 37.75.  Increased risk of heart disease with rheumatoid arthritis was discussed.  Dietary modifications and exercises were discussed.  A handout was placed in the AVS.  Orders: No orders of the defined types were placed in this encounter.  No orders of the defined types were placed in this encounter.    Follow-Up Instructions: Return in about 5 months (around 04/23/2022) for Rheumatoid arthritis.   Bo Merino, MD  Note - This record has been created using Editor, commissioning.  Chart creation errors have been sought, but may not always  have been located. Such creation errors do not reflect on  the standard of medical care.

## 2021-11-21 ENCOUNTER — Other Ambulatory Visit: Payer: Self-pay

## 2021-11-21 ENCOUNTER — Encounter: Payer: Self-pay | Admitting: Rheumatology

## 2021-11-21 ENCOUNTER — Ambulatory Visit: Payer: BC Managed Care – PPO | Admitting: Rheumatology

## 2021-11-21 VITALS — BP 131/81 | HR 87 | Ht 62.0 in | Wt 206.4 lb

## 2021-11-21 DIAGNOSIS — M19072 Primary osteoarthritis, left ankle and foot: Secondary | ICD-10-CM

## 2021-11-21 DIAGNOSIS — M19041 Primary osteoarthritis, right hand: Secondary | ICD-10-CM

## 2021-11-21 DIAGNOSIS — Z8679 Personal history of other diseases of the circulatory system: Secondary | ICD-10-CM

## 2021-11-21 DIAGNOSIS — M19042 Primary osteoarthritis, left hand: Secondary | ICD-10-CM

## 2021-11-21 DIAGNOSIS — M0609 Rheumatoid arthritis without rheumatoid factor, multiple sites: Secondary | ICD-10-CM | POA: Diagnosis not present

## 2021-11-21 DIAGNOSIS — Z79899 Other long term (current) drug therapy: Secondary | ICD-10-CM

## 2021-11-21 DIAGNOSIS — F5101 Primary insomnia: Secondary | ICD-10-CM

## 2021-11-21 DIAGNOSIS — M7061 Trochanteric bursitis, right hip: Secondary | ICD-10-CM | POA: Diagnosis not present

## 2021-11-21 DIAGNOSIS — M7062 Trochanteric bursitis, left hip: Secondary | ICD-10-CM

## 2021-11-21 DIAGNOSIS — R5383 Other fatigue: Secondary | ICD-10-CM

## 2021-11-21 DIAGNOSIS — E782 Mixed hyperlipidemia: Secondary | ICD-10-CM

## 2021-11-21 DIAGNOSIS — E559 Vitamin D deficiency, unspecified: Secondary | ICD-10-CM

## 2021-11-21 DIAGNOSIS — M19071 Primary osteoarthritis, right ankle and foot: Secondary | ICD-10-CM

## 2021-11-21 NOTE — Patient Instructions (Signed)
Vaccines You are taking a medication(s) that can suppress your immune system.  The following immunizations are recommended: Flu annually Covid-19  Td/Tdap (tetanus, diphtheria, pertussis) every 10 years Pneumonia (Prevnar 15 then Pneumovax 23 at least 1 year apart.  Alternatively, can take Prevnar 20 without needing additional dose) Shingrix: 2 doses from 4 weeks to 6 months apart  Please check with your PCP to make sure you are up to date.   Heart Disease Prevention   Your inflammatory disease increases your risk of heart disease which includes heart attack, stroke, atrial fibrillation (irregular heartbeats), high blood pressure, heart failure and atherosclerosis (plaque in the arteries).  It is important to reduce your risk by:   Keep blood pressure, cholesterol, and blood sugar at healthy levels   Smoking Cessation   Maintain a healthy weight  BMI 20-25   Eat a healthy diet  Plenty of fresh fruit, vegetables, and whole grains  Limit saturated fats, foods high in sodium, and added sugars  DASH and Mediterranean diet   Increase physical activity  Recommend moderate physically activity for 150 minutes per week/ 30 minutes a day for five days a week These can be broken up into three separate ten-minute sessions during the day.   Reduce Stress  Meditation, slow breathing exercises, yoga, coloring books  Dental visits twice a year   

## 2021-11-25 ENCOUNTER — Telehealth: Payer: Self-pay | Admitting: *Deleted

## 2021-11-25 NOTE — Telephone Encounter (Signed)
Jerline Pain, MD  ?11/25/2021  5:58 AM EST Back to Top  ?  ?? Normal sinus rhythm ?? No AFIB ?? No pauses ?? Rare PAC, PVC ?? 6 brief non sustained ventricular tachycardia episodes (5-7 beats). No sustained VT ?? Given sister with HOCM, and her ECHO with basal septal hypertrophy and brief NSVT, we will order a cardiac MRI to further evaluate for hypertrophic cardiomyopathy  ? ?

## 2021-11-26 ENCOUNTER — Ambulatory Visit: Payer: BC Managed Care – PPO | Admitting: Rheumatology

## 2021-11-28 NOTE — Telephone Encounter (Signed)
Left message for pt to call back for results and further orders.Marland Kitchen   ?

## 2021-12-01 ENCOUNTER — Encounter: Payer: Self-pay | Admitting: *Deleted

## 2021-12-01 NOTE — Telephone Encounter (Signed)
Letter of results mailed to pt's home address requesting she contact the office to be scheduled.   ?

## 2021-12-22 ENCOUNTER — Other Ambulatory Visit: Payer: Self-pay | Admitting: Physician Assistant

## 2021-12-22 DIAGNOSIS — M0609 Rheumatoid arthritis without rheumatoid factor, multiple sites: Secondary | ICD-10-CM

## 2021-12-22 NOTE — Telephone Encounter (Signed)
Next Visit: 05/01/2022 ? ?Last Visit: 11/21/2021 ? ?Labs: 10/24/2021, Vitamin D: 17.9 ?  WBC 3.9 ?  RBC 4.17 ?  Hgb 11.1 ?  Hct 33.9 ?  MCH 26.7 ? ?Eye exam: 07/22/2021  ? ?Current Dose per office note 11/21/2021: PLQ 200 mg BID M-F ? ?DX: Rheumatoid arthritis of multiple sites with negative rheumatoid factor  ? ?Last Fill: 09/29/2021 ? ?Okay to refill Plaquenil? ? ?

## 2022-02-02 DIAGNOSIS — M25561 Pain in right knee: Secondary | ICD-10-CM | POA: Diagnosis not present

## 2022-02-03 NOTE — Telephone Encounter (Signed)
Pt has never returned my calls or responded to the letter mailed requesting she call back to discuss need for further testing.  Will close this encounter for now. ?

## 2022-02-06 DIAGNOSIS — M25561 Pain in right knee: Secondary | ICD-10-CM | POA: Diagnosis not present

## 2022-02-20 ENCOUNTER — Ambulatory Visit
Admission: EM | Admit: 2022-02-20 | Discharge: 2022-02-20 | Disposition: A | Discharge status: Home / Self Care | Payer: BC Managed Care – PPO | Attending: Internal Medicine | Admitting: Internal Medicine

## 2022-02-20 DIAGNOSIS — L03116 Cellulitis of left lower limb: Secondary | ICD-10-CM

## 2022-02-20 DIAGNOSIS — R52 Pain, unspecified: Secondary | ICD-10-CM | POA: Diagnosis not present

## 2022-02-20 MED ORDER — DOXYCYCLINE HYCLATE 100 MG PO CAPS: 100.0000 mg | ORAL_CAPSULE | Freq: Two times a day (BID) | ORAL | 0 refills | Status: AC

## 2022-02-20 MED ORDER — IBUPROFEN 600 MG PO TABS: 600.0000 mg | ORAL_TABLET | Freq: Four times a day (QID) | ORAL | 0 refills | Status: DC | PRN
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
## 2022-02-20 NOTE — Discharge Instructions (Addendum)
Please take medications as prescribed The bruise will evolve over the next few weeks and you will see associated changes in color of the bruise. If you have worsening pain, headaches, persistent nausea/vomiting or blurry vision please return to urgent care for further evaluation. If you have worsening drainage from your left leg as well as redness please return to urgent care to be reevaluated

## 2022-02-23 DIAGNOSIS — M25511 Pain in right shoulder: Secondary | ICD-10-CM | POA: Diagnosis not present

## 2022-02-23 DIAGNOSIS — S81812A Laceration without foreign body, left lower leg, initial encounter: Secondary | ICD-10-CM | POA: Diagnosis not present

## 2022-02-23 DIAGNOSIS — M25512 Pain in left shoulder: Secondary | ICD-10-CM | POA: Diagnosis not present

## 2022-02-23 DIAGNOSIS — M25551 Pain in right hip: Secondary | ICD-10-CM | POA: Diagnosis not present

## 2022-02-24 ENCOUNTER — Other Ambulatory Visit (HOSPITAL_BASED_OUTPATIENT_CLINIC_OR_DEPARTMENT_OTHER): Payer: Self-pay | Admitting: Family Medicine

## 2022-02-24 ENCOUNTER — Ambulatory Visit (HOSPITAL_BASED_OUTPATIENT_CLINIC_OR_DEPARTMENT_OTHER)
Admission: RE | Admit: 2022-02-24 | Discharge: 2022-02-24 | Disposition: A | Discharge status: Home / Self Care | Payer: BC Managed Care – PPO | Source: Ambulatory Visit | Attending: Family Medicine | Admitting: Family Medicine

## 2022-02-24 DIAGNOSIS — R7989 Other specified abnormal findings of blood chemistry: Secondary | ICD-10-CM

## 2022-02-24 DIAGNOSIS — M7989 Other specified soft tissue disorders: Secondary | ICD-10-CM

## 2022-02-24 DIAGNOSIS — M79662 Pain in left lower leg: Secondary | ICD-10-CM | POA: Diagnosis not present

## 2022-03-10 ENCOUNTER — Encounter: Payer: Self-pay | Admitting: Emergency Medicine

## 2022-03-10 ENCOUNTER — Ambulatory Visit
Admission: EM | Admit: 2022-03-10 | Discharge: 2022-03-10 | Disposition: A | Discharge status: Home / Self Care | Payer: BC Managed Care – PPO | Attending: Physician Assistant | Admitting: Physician Assistant

## 2022-03-10 DIAGNOSIS — S81802D Unspecified open wound, left lower leg, subsequent encounter: Secondary | ICD-10-CM | POA: Diagnosis not present

## 2022-03-10 MED ORDER — SULFAMETHOXAZOLE-TRIMETHOPRIM 800-160 MG PO TABS: 1.0000 | ORAL_TABLET | Freq: Two times a day (BID) | ORAL | 0 refills | Status: AC

## 2022-03-10 MED ORDER — MUPIROCIN 2 % EX OINT: 1.0000 | TOPICAL_OINTMENT | Freq: Two times a day (BID) | CUTANEOUS | 0 refills | Status: DC

## 2022-03-10 NOTE — Discharge Instructions (Addendum)
We are going to treat for an infection.  Take Bactrim DS twice daily.  If he develop any rash or lesions stop the medication to be seen immediately.  Keep your leg elevated and use compression for symptom relief.  Use Bactroban ointment to help with healing and avoid Neosporin.  If symptoms or not improving I do recommend you follow-up with wound care specialist; call to schedule an appointment.  If anything worsens and you get unusual drainage, increased drainage, increased pain, fever, nausea, vomiting you need to be seen immediately.
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
## 2022-03-16 ENCOUNTER — Other Ambulatory Visit: Payer: Self-pay | Admitting: Physician Assistant

## 2022-03-16 DIAGNOSIS — S838X9D Sprain of other specified parts of unspecified knee, subsequent encounter: Secondary | ICD-10-CM | POA: Diagnosis not present

## 2022-03-16 DIAGNOSIS — M25559 Pain in unspecified hip: Secondary | ICD-10-CM | POA: Diagnosis not present

## 2022-03-16 DIAGNOSIS — M25519 Pain in unspecified shoulder: Secondary | ICD-10-CM | POA: Diagnosis not present

## 2022-03-16 DIAGNOSIS — M0609 Rheumatoid arthritis without rheumatoid factor, multiple sites: Secondary | ICD-10-CM

## 2022-03-25 ENCOUNTER — Encounter (HOSPITAL_BASED_OUTPATIENT_CLINIC_OR_DEPARTMENT_OTHER): Payer: BC Managed Care – PPO | Attending: General Surgery | Admitting: General Surgery

## 2022-03-25 DIAGNOSIS — I10 Essential (primary) hypertension: Secondary | ICD-10-CM | POA: Insufficient documentation

## 2022-03-25 DIAGNOSIS — I890 Lymphedema, not elsewhere classified: Secondary | ICD-10-CM | POA: Insufficient documentation

## 2022-03-25 DIAGNOSIS — L97822 Non-pressure chronic ulcer of other part of left lower leg with fat layer exposed: Secondary | ICD-10-CM | POA: Diagnosis not present

## 2022-03-25 DIAGNOSIS — M069 Rheumatoid arthritis, unspecified: Secondary | ICD-10-CM | POA: Insufficient documentation
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
## 2022-03-25 NOTE — Progress Notes (Signed)
Stacey Turner, Stacey Turner ([id number]) Visit Report for 03/25/2022 Allergy List Details Patient Name: Date of Service: Stacey Turner, Stacey Turner 03/25/2022 1:30 PM Medical Record Number: [id number] Patient Account Number: [id number] Date of Birth/Sex: Treating RN: 09/29/1960 (61 y.o. [name] Primary Care Zariana Strub: [name] Other Clinician: Referring Joelynn Dust: Treating Daniyah Fohl/Extender: [name], [name] [name]: 0 Allergies Active Allergies latex fluticasone penicillin Allergy Notes Electronic Signature(s) Signed: 03/25/2022 5:10:18 PM By: Adline Peals Entered By: Adline Peals on 03/25/2022 13:37:24 -------------------------------------------------------------------------------- Arrival Information Details Patient Name: Date of Service: Stacey Turner. 03/25/2022 1:30 PM Medical Record Number: [id number] Patient Account Number: [id number] Date of Birth/Sex: Treating RN: 21-Jun-1961 (61 y.o. [name] Primary Care Taffany Heiser: [name] Other Clinician: Referring Bonnie Overdorf: Treating Sarahbeth Cashin/Extender: [name], [name] [name]: 0 Visit Information Patient Arrived: Ambulatory Arrival Time: 13:33 Accompanied By: self Transfer Assistance: None Patient Identification Verified: Yes Secondary Verification Process Completed: Yes Patient Requires Transmission-Based Precautions: No Patient Has Alerts: No Electronic Signature(s) Signed: 03/25/2022 5:10:18 PM By: Adline Peals Entered By: Adline Peals on 03/25/2022 13:36:03 -------------------------------------------------------------------------------- Clinic Level of Care Assessment Details Patient Name: Date of Service: Stacey Turner, Stacey Turner 03/25/2022 1:30 PM Medical Record Number: [id number] Patient Account Number: [id number] Date of Birth/Sex: Treating RN: 1961-06-15 (61 y.o. [name] Primary Care Azzan Butler: [name] Other  Clinician: Referring Colman Birdwell: Treating Lakera Viall/Extender: [name], [name] [name]: 0 Clinic Level of Care Assessment Items TOOL 1 Quantity Score X- 1 0 Use when EandM and Procedure is performed on INITIAL visit ASSESSMENTS - Nursing Assessment / Reassessment X- 1 20 General Physical Exam (combine w/ comprehensive assessment (listed just below) when performed on new pt. evals) X- 1 25 Comprehensive Assessment (HX, ROS, Risk Assessments, Wounds Hx, etc.) ASSESSMENTS - Wound and Skin Assessment / Reassessment '[]'$  - 0 Dermatologic / Skin Assessment (not related to wound area) ASSESSMENTS - Ostomy and/or Continence Assessment and Care '[]'$  - 0 Incontinence Assessment and Management '[]'$  - 0 Ostomy Care Assessment and Management (repouching, etc.) PROCESS - Coordination of Care X - Simple Patient / Family Education for ongoing care 1 15 '[]'$  - 0 Complex (extensive) Patient / Family Education for ongoing care X- 1 10 Staff obtains Programmer, systems, Records, T Results / Process Orders est '[]'$  - 0 Staff telephones HHA, Nursing Homes / Clarify orders / etc '[]'$  - 0 Routine Transfer to another Facility (non-emergent condition) '[]'$  - 0 Routine Hospital Admission (non-emergent condition) X- 1 15 New Admissions / Biomedical engineer / Ordering NPWT Apligraf, etc. , '[]'$  - 0 Emergency Hospital Admission (emergent condition) PROCESS - Special Needs '[]'$  - 0 Pediatric / Minor Patient Management '[]'$  - 0 Isolation Patient Management '[]'$  - 0 Hearing / Language / Visual special needs '[]'$  - 0 Assessment of Community assistance (transportation, D/C planning, etc.) '[]'$  - 0 Additional assistance / Altered mentation '[]'$  - 0 Support Surface(s) Assessment (bed, cushion, seat, etc.) INTERVENTIONS - Miscellaneous '[]'$  - 0 External ear exam '[]'$  - 0 Patient Transfer (multiple staff / Civil Service fast streamer / Similar devices) '[]'$  - 0 Simple Staple / Suture removal (25 or less) '[]'$  - 0 Complex Staple /  Suture removal (26 or more) '[]'$  - 0 Hypo/Hyperglycemic Management (do not check if billed separately) X- 1 15 Ankle / Brachial Index (ABI) - do not check if billed separately Has the patient been seen at the hospital within the last three years: Yes Total Score: 100 Level Of Care: New/Established - Level 3  Electronic Signature(s) Signed: 03/25/2022 5:16:00 PM By: Adline Peals Signed: 03/25/2022 5:16:00 PM By: Sabas Sous By: Adline Peals on 03/25/2022 17:14:42 -------------------------------------------------------------------------------- Encounter Discharge Information Details Patient Name: Date of Service: Stacey Izell [place]. 03/25/2022 1:30 PM Medical Record Number: [id number] Patient Account Number: [id number] Date of Birth/Sex: Treating RN: 1960/10/20 (61 y.o. [name] Primary Care Xabi Wittler: [name] Other Clinician: Referring Jahni Nazar: Treating Magdelyn Roebuck/Extender: [name], [name] [name]: 0 Encounter Discharge Information Items Post Procedure Vitals Discharge Condition: Stable Temperature (F): 98.3 Ambulatory Status: Ambulatory Pulse (bpm): 86 Discharge Destination: Home Respiratory Rate (breaths/min): 16 Transportation: Private Auto Blood Pressure (mmHg): 127/77 Accompanied By: self Schedule Follow-up Appointment: Yes Clinical Summary of Care: Patient Declined Electronic Signature(s) Signed: 03/25/2022 5:10:18 PM By: Adline Peals Entered By: Adline Peals on 03/25/2022 16:21:59 -------------------------------------------------------------------------------- Lower Extremity Assessment Details Patient Name: Date of Service: Stacey Turner 03/25/2022 1:30 PM Medical Record Number: [id number] Patient Account Number: [id number] Date of Birth/Sex: Treating RN: August 09, 1961 (61 y.o. [name] Primary Care Janene Yousuf: [name] Other Clinician: Referring Osualdo Hansell: Treating  Lyndsi Altic/Extender: [name], [name] [name]: 0 Edema Assessment Assessed: [Left: No] [Right: No] E[Left: dema] [Right: :] Calf Left: Right: Point of Measurement: From Medial Instep 36.6 cm Ankle Left: Right: Point of Measurement: From Medial Instep 21.5 cm Vascular Assessment Pulses: Dorsalis Pedis Palpable: [Left:Yes] Blood Pressure: Brachial: [Right:127] Ankle: [Right:Dorsalis Pedis: 180] Ankle Brachial Index: [Right:1.42] Electronic Signature(s) Signed: 03/25/2022 5:10:18 PM By: Adline Peals Entered By: Adline Peals on 03/25/2022 13:58:17 -------------------------------------------------------------------------------- Multi Wound Chart Details Patient Name: Date of Service: Stacey Turner. 03/25/2022 1:30 PM Medical Record Number: [id number] Patient Account Number: [id number] Date of Birth/Sex: Treating RN: [birth year] (61 y.o. [name] Primary Care Kilea Mccarey: [name] Other Clinician: Referring Jonathandavid Marlett: Treating Hassen Bruun/Extender: [name], [name] [name]: 0 Vital Signs Height(in): 27 Pulse(bpm): 48 Weight(lbs): 198 Blood Pressure(mmHg): 127/77 Body Mass Index(BMI): 36.2 Temperature(F): 98.3 Respiratory Rate(breaths/min): 16 Photos: [N/A:N/A] Left, Medial Lower Leg N/A N/A Wound Location: Trauma N/A N/A Wounding Event: Trauma, Other N/A N/A Primary Etiology: Cataracts, Anemia, Arrhythmia, N/A N/A Comorbid History: Hypertension, Rheumatoid Arthritis 02/17/2022 N/A N/A Date Acquired: 0 N/A N/A Weeks of Treatment: Open N/A N/A Wound Status: No N/A N/A Wound Recurrence: 0.9x0.5x0.1 N/A N/A Measurements L x W x D (cm) 0.353 N/A N/A A (cm) : rea 0.035 N/A N/A Volume (cm) : 0.00% N/A N/A % Reduction in A rea: 0.00% N/A N/A % Reduction in Volume: Full Thickness Without Exposed N/A N/A Classification: Support Structures Medium N/A N/A Exudate A  mount: Serosanguineous N/A N/A Exudate Type: red, brown N/A N/A Exudate Color: Distinct, outline attached N/A N/A Wound Margin: Medium (34-66%) N/A N/A Granulation A mount: Red N/A N/A Granulation Quality: Medium (34-66%) N/A N/A Necrotic A mount: Fat Layer (Subcutaneous Tissue): Yes N/A N/A Exposed Structures: Fascia: No Tendon: No Muscle: No Joint: No Bone: No Small (1-33%) N/A N/A Epithelialization: Debridement - Selective/Open Wound N/A N/A Debridement: Pre-procedure Verification/Time Out 14:08 N/A N/A Taken: Lidocaine 4% Topical Solution N/A N/A Pain Control: Necrotic/Eschar, Slough N/A N/A Tissue Debrided: Non-Viable Tissue N/A N/A Level: 0.45 N/A N/A Debridement A (sq cm): rea Curette N/A N/A Instrument: Minimum N/A N/A Bleeding: Pressure N/A N/A Hemostasis A chieved: 0 N/A N/A Procedural Pain: 0 N/A N/A Post Procedural Pain: Procedure was tolerated well N/A N/A Debridement Treatment Response: 0.9x0.5x0.1 N/A N/A Post Debridement Measurements L x W x D (cm) 0.035 N/A N/A Post  Debridement Volume: (cm) Debridement N/A N/A Procedures Performed: Treatment Notes Electronic Signature(s) Signed: 03/25/2022 2:17:41 PM By: Fredirick Maudlin MD FACS Signed: 03/25/2022 5:10:18 PM By: Adline Peals Entered By: Fredirick Maudlin on 03/25/2022 14:17:40 -------------------------------------------------------------------------------- Multi-Disciplinary Care Plan Details Patient Name: Date of Service: Stacey Turner. 03/25/2022 1:30 PM Medical Record Number: [id number] Patient Account Number: [id number] Date of Birth/Sex: Treating RN: 11/24/1960 (61 y.o. [name] Primary Care Sumayah Bearse: [name] Other Clinician: Referring Aahana Elza: Treating Sudiksha Victor/Extender: [name], [name] [name]: 0 Active Inactive Electronic Signature(s) Signed: 03/25/2022 5:16:00 PM By: Adline Peals Previous Signature: 03/25/2022  5:10:18 PM Version By: Sabas Sous By: Adline Peals on 03/25/2022 17:13:43 -------------------------------------------------------------------------------- Pain Assessment Details Patient Name: Date of Service: Stacey Turner 03/25/2022 1:30 PM Medical Record Number: [id number] Patient Account Number: [id number] Date of Birth/Sex: Treating RN: 10/16/1960 (61 y.o. [name] Primary Care Angelyn Osterberg: [name] Other Clinician: Referring Carry Weesner: Treating Massimiliano Rohleder/Extender: [name], [name] [name]: 0 Active Problems Location of Pain Severity and Description of Pain Patient Has Paino Yes Site Locations Pain Location: Pain Location: Pain in Ulcers Duration of the Pain. Constant / Intermittento Constant Rate the pain. Current Pain Level: 4 Character of Pain Describe the Pain: Burning Pain Management and Medication Current Pain Management: Electronic Signature(s) Signed: 03/25/2022 5:10:18 PM By: Adline Peals Entered By: Adline Peals on 03/25/2022 13:50:08 -------------------------------------------------------------------------------- Patient/Caregiver Education Details Patient Name: Date of Service: Stacey Turner 7/5/2023andnbsp1:30 PM Medical Record Number: [id number] Patient Account Number: [id number] Date of Birth/Gender: Treating RN: [birth year] (61 y.o. [name] Primary Care Physician: [name] Other Clinician: Referring Physician: Treating Physician/Extender: [name], Oris Drone in Treatment: 0 Education Assessment Education Provided To: Patient Education Topics Provided Welcome T [place]: o Handouts: Welcome T [facility] [place] o Methods: Explain/Verbal, Printed Responses: Reinforcements needed, State content correctly Wound/Skin Impairment: Handouts: Skin Care Do's and Dont's Methods: Explain/Verbal, Printed Responses:  Reinforcements needed, State content correctly Electronic Signature(s) Signed: 03/25/2022 5:10:18 PM By: Adline Peals Entered By: Adline Peals on 03/25/2022 14:08:22 -------------------------------------------------------------------------------- Wound Assessment Details Patient Name: Date of Service: Stacey Turner. 03/25/2022 1:30 PM Medical Record Number: [id number] Patient Account Number: [id number] Date of Birth/Sex: Treating RN: 21-Jan-1961 (62 y.o. [name] Primary Care Erving Sassano: [name] Other Clinician: Referring Ladana Chavero: Treating Lilla Callejo/Extender: [name], [name] [name]: 0 Wound Status Wound Number: 1 Primary Trauma, Other Etiology: Wound Location: Left, Medial Lower Leg Wound Status: Open Wounding Event: Trauma Comorbid Cataracts, Anemia, Arrhythmia, Hypertension, Rheumatoid Date Acquired: 02/17/2022 History: Arthritis Weeks Of Treatment: 0 Clustered Wound: No Photos Wound Measurements Length: (cm) 0.9 Width: (cm) 0.5 Depth: (cm) 0.1 Area: (cm) 0.353 Volume: (cm) 0.035 % Reduction in Area: 0% % Reduction in Volume: 0% Epithelialization: Small (1-33%) Tunneling: No Undermining: No Wound Description Classification: Full Thickness Without Exposed Support Str Wound Margin: Distinct, outline attached Exudate Amount: Medium Exudate Type: Serosanguineous Exudate Color: red, brown uctures Wound Bed Granulation Amount: Medium (34-66%) Exposed Structure Granulation Quality: Red Fascia Exposed: No Necrotic Amount: Medium (34-66%) Fat Layer (Subcutaneous Tissue) Exposed: Yes Necrotic Quality: Adherent Slough Tendon Exposed: No Muscle Exposed: No Joint Exposed: No Bone Exposed: No Electronic Signature(s) Signed: 03/25/2022 5:10:18 PM By: Adline Peals Entered By: Adline Peals on 03/25/2022 13:55:51 -------------------------------------------------------------------------------- Vitals  Details Patient Name: Date of Service: Stacey Izell [place]. 03/25/2022 1:30 PM Medical Record Number: [id number] Patient Account Number: [id number] Date of Birth/Sex:  Treating RN: 1961/03/25 (61 y.o. [name] Primary Care Temperence Zenor: [name] Other Clinician: Referring Deann Mclaine: Treating Tytiana Coles/Extender: [name], [name] [name]: 0 Vital Signs Time Taken: 13:56 Temperature (F): 98.3 Height (in): 62 Pulse (bpm): 86 Source: Stated Respiratory Rate (breaths/min): 16 Weight (lbs): 198 Blood Pressure (mmHg): 127/77 Source: Stated Reference Range: 80 - 120 mg / dl Body Mass Index (BMI): 36.2 Electronic Signature(s) Signed: 03/25/2022 5:10:18 PM By: Adline Peals Entered By: Adline Peals on 03/25/2022 13:36:30

## 2022-03-25 NOTE — Progress Notes (Signed)
Stacey Turner, Stacey Turner ([id number]) Visit Report for 03/25/2022 Abuse Risk Screen Details Patient Name: Date of Service: Stacey Turner, Stacey Turner 03/25/2022 1:30 PM Medical Record Number: [id number] Patient Account Number: [id number] Date of Birth/Sex: Treating RN: 18-Dec-1960 (61 y.o. Stacey Turner Primary Care Stacey Turner: Stacey Turner Other Clinician: Referring Stacey Turner: Treating Stacey Turner: Stacey Turner RA SPET, Stacey Stacey Turner: 0 Abuse Risk Screen Items Answer ABUSE RISK SCREEN: Has anyone close to you tried to hurt or harm you recentlyo No Do you feel uncomfortable with anyone in your familyo No Has anyone forced you do things that you didnt want to doo No Electronic Signature(s) Signed: 03/25/2022 5:10:18 PM By: Adline Turner Entered By: Adline Turner on 03/25/2022 13:45:35 -------------------------------------------------------------------------------- Activities of Daily Living Details Patient Name: Date of Service: Stacey Turner, Stacey Turner 03/25/2022 1:30 PM Medical Record Number: [id number] Patient Account Number: [id number] Date of Birth/Sex: Treating RN: 1961-07-23 (61 y.o. Stacey Turner Primary Care Stacey Turner: Stacey Turner Other Clinician: Referring Stacey Turner: Treating Stacey Turner/Extender: Stacey Turner RA SPET, Stacey Stacey Turner: 0 Activities of Daily Living Items Answer Activities of Daily Living (Please select one for each item) Drive Automobile Completely Able T Medications ake Completely Able Use T elephone Completely Able Care for Appearance Completely Able Use T oilet Completely Able Bath / Shower Completely Able Dress Self Completely Able Feed Self Completely Able Walk Completely Able Get In / Out Bed Completely Able Housework Completely Able Prepare Meals Completely [place] for Self Completely Able Electronic Signature(s) Signed: 03/25/2022 5:10:18 PM By: Adline Turner Entered By: Adline Turner on 03/25/2022 13:46:14 -------------------------------------------------------------------------------- Education Screening Details Patient Name: Date of Service: Stacey Turner. 03/25/2022 1:30 PM Medical Record Number: [id number] Patient Account Number: [id number] Date of Birth/Sex: Treating RN: 02/26/1961 (61 y.o. Stacey Turner Primary Care Stacey Turner: Stacey Turner Other Clinician: Referring Stacey Turner: Treating Stacey Turner: Stacey Turner RA SPET, Stacey Stacey Turner: 0 Primary Learner Assessed: Patient Learning Preferences/Education Level/Primary Language Learning Preference: Explanation, Demonstration, Video, Printed Material Highest Education Level: College or Above Preferred Language: English Cognitive Barrier Language Barrier: No Translator Needed: No Memory Deficit: No Emotional Barrier: No Cultural/Religious Beliefs Affecting Medical Care: No Physical Barrier Impaired Vision: No Impaired Hearing: No Decreased Hand dexterity: No Knowledge/Comprehension Knowledge Level: Medium Comprehension Level: Medium Ability to understand written instructions: Medium Ability to understand verbal instructions: Medium Motivation Anxiety Level: Calm Cooperation: Cooperative Education Importance: Acknowledges Need Interest in Health Problems: Asks Questions Perception: Coherent Willingness to Engage in Self-Management Medium Activities: Readiness to Engage in Self-Management Medium Activities: Electronic Signature(s) Signed: 03/25/2022 5:10:18 PM By: Adline Turner Entered By: Adline Turner on 03/25/2022 13:47:24 -------------------------------------------------------------------------------- Fall Risk Assessment Details Patient Name: Date of Service: Stacey Izell [place]. 03/25/2022 1:30 PM Medical Record Number: [id number] Patient Account Number: [id number] Date of Birth/Sex: Treating RN: 1961/02/22 (61 y.o. Stacey Turner Primary Care  Stacey Turner: Stacey Turner Other Clinician: Referring Stacey Turner: Treating Stacey Turner: Stacey Turner RA SPET, Stacey Stacey Turner: 0 Fall Risk Assessment Items Have you had 2 or more falls in the last 12 monthso 0 No Have you had any fall that resulted in injury in the last 12 monthso 0 No FALLS RISK SCREEN History of falling - immediate or within 3 months 0 No Secondary diagnosis (Do you have 2 or more medical diagnoseso) 15 Yes Ambulatory aid None/bed rest/wheelchair/nurse 0 No Crutches/cane/walker 0 No Furniture 0 No Intravenous therapy Access/Saline/Heparin Lock 0 No Gait/Transferring Normal/ bed rest/ wheelchair 0 No Weak (short steps with  or without shuffle, stooped but able to lift head while walking, may seek 0 No support from furniture) Impaired (short steps with shuffle, may have difficulty arising from chair, head down, impaired 0 No balance) Mental Status Oriented to own ability 0 No Electronic Signature(s) Signed: 03/25/2022 5:10:18 PM By: Adline Turner Entered By: Adline Turner on 03/25/2022 13:47:48 -------------------------------------------------------------------------------- Foot Assessment Details Patient Name: Date of Service: Stacey Turner. 03/25/2022 1:30 PM Medical Record Number: [id number] Patient Account Number: [id number] Date of Birth/Sex: Treating RN: [birth year] (61 y.o. Stacey Turner Primary Care Stacey Turner: Stacey Turner Other Clinician: Referring Stacey Turner: Treating Stacey Turner/Extender: Stacey Turner RA SPET, Stacey Stacey Turner: 0 Foot Assessment Items Site Locations + = Sensation present, - = Sensation absent, C = Callus, U = Ulcer R = Redness, W = Warmth, M = Maceration, PU = Pre-ulcerative lesion F = Fissure, S = Swelling, D = Dryness Assessment Right: Left: Other Deformity: No No Prior Foot Ulcer: No No Prior Amputation: No No Charcot Joint: No No Ambulatory Status: Ambulatory Without  Help Gait: Electronic Signature(s) Signed: 03/25/2022 5:10:18 PM By: Adline Turner Entered By: Adline Turner on 03/25/2022 13:48:06 -------------------------------------------------------------------------------- Nutrition Risk Screening Details Patient Name: Date of Service: Stacey Turner, Stacey Turner 03/25/2022 1:30 PM Medical Record Number: [id number] Patient Account Number: [id number] Date of Birth/Sex: Treating RN: 08-28-1961 (61 y.o. Stacey Turner Primary Care Swan Zayed: Stacey Turner Other Clinician: Referring Haislee Corso: Treating Camdyn Beske/Extender: Stacey Turner RA SPET, Stacey Stacey Turner: 0 Height (in): 62 Weight (lbs): 198 Body Mass Index (BMI): 36.2 Nutrition Risk Screening Items Score Screening NUTRITION RISK SCREEN: I have an illness or condition that made me change the kind and/or amount of food I eat 0 No I eat fewer than two meals per day 0 No I eat few fruits and vegetables, or milk products 0 No I have three or more drinks of beer, liquor or wine almost every day 0 No I have tooth or mouth problems that make it hard for me to eat 0 No I don't always have enough money to buy the food I need 0 No I eat alone most of the time 0 No I take three or more different prescribed or over-the-counter drugs a day 1 Yes Without wanting to, I have lost or gained 10 pounds in the last six months 0 No I am not always physically able to shop, cook and/or feed myself 0 No Nutrition Protocols Good Risk Protocol 0 No interventions needed Moderate Risk Protocol High Risk Proctocol Risk Level: Good Risk Score: 1 Electronic Signature(s) Signed: 03/25/2022 5:10:18 PM By: Adline Turner Entered By: Adline Turner on 03/25/2022 13:46:54

## 2022-04-06 DIAGNOSIS — M25559 Pain in unspecified hip: Secondary | ICD-10-CM | POA: Diagnosis not present

## 2022-04-06 DIAGNOSIS — S838X9D Sprain of other specified parts of unspecified knee, subsequent encounter: Secondary | ICD-10-CM | POA: Diagnosis not present

## 2022-04-06 DIAGNOSIS — M25519 Pain in unspecified shoulder: Secondary | ICD-10-CM | POA: Diagnosis not present

## 2022-04-10 DIAGNOSIS — L97821 Non-pressure chronic ulcer of other part of left lower leg limited to breakdown of skin: Secondary | ICD-10-CM | POA: Diagnosis not present

## 2022-04-10 DIAGNOSIS — I10 Essential (primary) hypertension: Secondary | ICD-10-CM | POA: Diagnosis not present

## 2022-04-10 DIAGNOSIS — M25511 Pain in right shoulder: Secondary | ICD-10-CM | POA: Diagnosis not present

## 2022-04-10 DIAGNOSIS — M25512 Pain in left shoulder: Secondary | ICD-10-CM | POA: Diagnosis not present

## 2022-04-14 DIAGNOSIS — M25552 Pain in left hip: Secondary | ICD-10-CM | POA: Diagnosis not present

## 2022-04-14 DIAGNOSIS — M25551 Pain in right hip: Secondary | ICD-10-CM | POA: Diagnosis not present

## 2022-04-14 DIAGNOSIS — M25511 Pain in right shoulder: Secondary | ICD-10-CM | POA: Diagnosis not present

## 2022-04-14 DIAGNOSIS — M25512 Pain in left shoulder: Secondary | ICD-10-CM | POA: Diagnosis not present

## 2022-04-17 NOTE — Progress Notes (Deleted)
Office Visit Note  Patient: Stacey Turner             Date of Birth: 1961-01-18           MRN: [id number]             PCP: [name], MD Referring: [name], MD Visit Date: 05/01/2022 Occupation: '@GUAROCC'$ @  Subjective:  No chief complaint on file.   History of Present Illness: Stacey Turner is a 61 y.o. female ***   Activities of Daily Living:  Patient reports morning stiffness for *** {minute/hour:19697}.   Patient {ACTIONS;DENIES/REPORTS:21021675::"Denies"} nocturnal pain.  Difficulty dressing/grooming: {ACTIONS;DENIES/REPORTS:21021675::"Denies"} Difficulty climbing stairs: {ACTIONS;DENIES/REPORTS:21021675::"Denies"} Difficulty getting out of chair: {ACTIONS;DENIES/REPORTS:21021675::"Denies"} Difficulty using hands for taps, buttons, cutlery, and/or writing: {ACTIONS;DENIES/REPORTS:21021675::"Denies"}  No Rheumatology ROS completed.   PMFS History:  Patient Active Problem List   Diagnosis Date Noted   Chest pain of uncertain etiology 87/56/4332   Primary insomnia 01/16/2017   Rheumatoid arthritis of multiple sites with negative rheumatoid factor ([place]) 01/05/2017   High risk medication use 01/05/2017   Trochanteric bursitis of both hips 01/05/2017   Palpitations 10/06/2013   Cardiac murmur 10/06/2013   Essential hypertension 10/06/2013   Hyperlipidemia 10/06/2013   S/P myomectomy 03/14/2012   Obesity 01/19/2012   Fibroids 01/19/2012   Pelvic pain 01/19/2012    Past Medical History:  Diagnosis Date   Anxiety    Cardiac murmur    Class 2 severe obesity due to excess calories with serious comorbidity in adult, unspecified BMI ([place])    Elevated white blood cell count    Fibroid uterus 2001   Fibroids    Grief    H. 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   Hyperlipidemia    Hypertension     Irregular periods/menstrual cycles 1999   LVH (left ventricular hypertrophy) due to hypertensive disease    Monilial vaginitis 2003   Palpitations    Pelvic pain in female 11/25/2011   PONV (postoperative nausea and vomiting)    Rheumatoid arthritis ([place])    Seasonal allergic rhinitis    Sinusitis 2009   Situational stress    Stress 2004   Vitamin D deficiency    Yeast vaginitis 2004    Family History  Problem Relation Age of Onset   Hyperlipidemia Mother    Hypertension Mother    Heart failure Mother    Diabetes Mother    Hyperlipidemia Father    Hypertension Father    Cancer Father        Colon   Hypertension Sister    Hyperlipidemia Sister    Heart Problems Sister    Hypertrophic cardiomyopathy Sister    Hypertension Brother    Breast cancer Neg Hx    Past Surgical History:  Procedure Laterality Date   BREAST BIOPSY Left 2016   BUNIONECTOMY     GANGLION CYST EXCISION     MYOMECTOMY  12/09/2011   Procedure: MYOMECTOMY;  Surgeon: Ena Dawley, MD;  Location: [facility];  Service: Gynecology;  Laterality: N/A;  Abdominal Myomectomy, Biopsy abdominal Myometrium   Social History   Social History Narrative   Not on file   Immunization History  Administered Date(s) Administered   PFIZER(Purple Top)SARS-COV-2 Vaccination 05/16/2020, 06/06/2020     Objective: Vital Signs: LMP 12/27/2013    Physical Exam  Musculoskeletal Exam: ***  CDAI Exam: CDAI Score: -- Patient Global: --; Provider Global: -- Swollen: --; Tender: -- Joint Exam 05/01/2022   No joint exam has been documented for this visit   There is currently no information documented on the homunculus. Go to the Rheumatology activity and complete the homunculus joint exam.  Investigation: No additional findings.  Imaging: No results found.  Recent Labs: Lab Results  Component Value Date   WBC 6.5 05/17/2021   HGB 11.6 (L) 05/17/2021   PLT 243 05/17/2021   NA 141 05/17/2021   K 3.1 (L)  05/17/2021   CL 105 05/17/2021   CO2 25 05/17/2021   GLUCOSE 116 (H) 05/17/2021   BUN 13 05/17/2021   CREATININE 0.74 05/17/2021   BILITOT 0.3 01/22/2021   ALKPHOS 49 01/19/2017   AST 15 01/22/2021   ALT 19 01/22/2021   PROT 6.9 01/22/2021   ALBUMIN 4.2 01/19/2017   CALCIUM 9.1 05/17/2021   GFRAA 104 01/22/2021    Speciality Comments: PLQ Eye Exam: 07/22/2021 [facility] Follow up in 1 year    Procedures:  No procedures performed Allergies: Latex, Fluticasone, and Penicillins   Assessment / Plan:     Visit Diagnoses: Rheumatoid arthritis of multiple sites with negative rheumatoid factor (HCC)  High risk medication use  Primary osteoarthritis of both hands  Trochanteric bursitis of both hips  Primary osteoarthritis of both feet  Vitamin D deficiency  Other fatigue  Primary insomnia  History of hypertension  Mixed hyperlipidemia  Orders: No orders of the defined types were placed in this encounter.  No orders of the defined types were placed in this encounter.   Face-to-face time spent with patient was *** minutes. Greater than 50% of time was spent in counseling and coordination of care.  Follow-Up Instructions: No follow-ups on file.   Ofilia Neas, PA-C  Note - This record has been created using Dragon software.  Chart creation errors have been sought, but may not always  have been located. Such creation errors do not reflect on  the standard of medical care.

## 2022-05-01 ENCOUNTER — Ambulatory Visit: Payer: BC Managed Care – PPO | Admitting: Rheumatology

## 2022-05-01 ENCOUNTER — Other Ambulatory Visit: Payer: Self-pay | Admitting: Family Medicine

## 2022-05-01 DIAGNOSIS — Z1231 Encounter for screening mammogram for malignant neoplasm of breast: Secondary | ICD-10-CM

## 2022-05-01 DIAGNOSIS — Z8679 Personal history of other diseases of the circulatory system: Secondary | ICD-10-CM

## 2022-05-01 DIAGNOSIS — R5383 Other fatigue: Secondary | ICD-10-CM

## 2022-05-01 DIAGNOSIS — E782 Mixed hyperlipidemia: Secondary | ICD-10-CM

## 2022-05-01 DIAGNOSIS — Z79899 Other long term (current) drug therapy: Secondary | ICD-10-CM

## 2022-05-01 DIAGNOSIS — M0609 Rheumatoid arthritis without rheumatoid factor, multiple sites: Secondary | ICD-10-CM

## 2022-05-01 DIAGNOSIS — M19071 Primary osteoarthritis, right ankle and foot: Secondary | ICD-10-CM

## 2022-05-01 DIAGNOSIS — M7061 Trochanteric bursitis, right hip: Secondary | ICD-10-CM

## 2022-05-01 DIAGNOSIS — F5101 Primary insomnia: Secondary | ICD-10-CM

## 2022-05-01 DIAGNOSIS — M19041 Primary osteoarthritis, right hand: Secondary | ICD-10-CM

## 2022-05-01 DIAGNOSIS — E559 Vitamin D deficiency, unspecified: Secondary | ICD-10-CM

## 2022-05-08 DIAGNOSIS — F419 Anxiety disorder, unspecified: Secondary | ICD-10-CM | POA: Diagnosis not present

## 2022-05-08 DIAGNOSIS — D649 Anemia, unspecified: Secondary | ICD-10-CM | POA: Diagnosis not present

## 2022-05-08 DIAGNOSIS — E559 Vitamin D deficiency, unspecified: Secondary | ICD-10-CM | POA: Diagnosis not present

## 2022-05-08 DIAGNOSIS — I10 Essential (primary) hypertension: Secondary | ICD-10-CM | POA: Diagnosis not present

## 2022-05-08 DIAGNOSIS — E785 Hyperlipidemia, unspecified: Secondary | ICD-10-CM | POA: Diagnosis not present

## 2022-05-12 ENCOUNTER — Telehealth: Payer: Self-pay | Admitting: *Deleted

## 2022-05-12 NOTE — Telephone Encounter (Signed)
Labs received from:[facility]  Drawn on:05/08/2022  Reviewed by:Hazel Sams, PA-C  Labs drawn:CBC, CMP  Results: Hgb 11.5   Hct 35.4   MCH 26.6   Baso % 1.2  Patient is on PLQ 200 mg po BID Monday-Friday.

## 2022-06-04 NOTE — Progress Notes (Deleted)
Office Visit Note  Patient: Stacey Turner             Date of Birth: [birth year]           MRN: [id number]             PCP: [name], MD Referring: [name], MD Visit Date: 06/18/2022 Occupation: '@GUAROCC'$ @  Subjective:  No chief complaint on file.   History of Present Illness: Stacey Turner is a 61 y.o. female ***   Activities of Daily Living:  Patient reports morning stiffness for *** {minute/hour:19697}.   Patient {ACTIONS;DENIES/REPORTS:21021675::"Denies"} nocturnal pain.  Difficulty dressing/grooming: {ACTIONS;DENIES/REPORTS:21021675::"Denies"} Difficulty climbing stairs: {ACTIONS;DENIES/REPORTS:21021675::"Denies"} Difficulty getting out of chair: {ACTIONS;DENIES/REPORTS:21021675::"Denies"} Difficulty using hands for taps, buttons, cutlery, and/or writing: {ACTIONS;DENIES/REPORTS:21021675::"Denies"}  No Rheumatology ROS completed.   PMFS History:  Patient Active Problem List   Diagnosis Date Noted  . Chest pain of uncertain etiology 10/93/2355  . Primary insomnia 01/16/2017  . Rheumatoid arthritis of multiple sites with negative rheumatoid factor ([place]) 01/05/2017  . High risk medication use 01/05/2017  . Trochanteric bursitis of both hips 01/05/2017  . Palpitations 10/06/2013  . Cardiac murmur 10/06/2013  . Essential hypertension 10/06/2013  . Hyperlipidemia 10/06/2013  . S/P myomectomy 03/14/2012  . Obesity 01/19/2012  . Fibroids 01/19/2012  . Pelvic pain 01/19/2012    Past Medical History:  Diagnosis Date  . Anxiety   . Cardiac murmur   . Class 2 severe obesity due to excess calories with serious comorbidity in adult, unspecified BMI ([place])   . Elevated white blood cell count   . Fibroid uterus 2001  . Fibroids   . Grief   . H. pylori infection   . H/O abdominal pain 04/24/2011  . H/O dysmenorrhea 2001  . H/O hemorrhoids 2006  . H/O: menorrhagia 2001  . Headache(784.0)   . High cholesterol 2003  . 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  Hyperlipidemia   . Hypertension   . Irregular periods/menstrual cycles 1999  . LVH (left ventricular hypertrophy) due to hypertensive disease   . Monilial vaginitis 2003  . Palpitations   . Pelvic pain in female 11/25/2011  . PONV (postoperative nausea and vomiting)   . Rheumatoid arthritis ([place])   . Seasonal allergic rhinitis   . Sinusitis 2009  . Situational stress   . Stress 2004  . Vitamin D deficiency   . Yeast vaginitis 2004    Family History  Problem Relation Age of Onset  . Hyperlipidemia Mother   . Hypertension Mother   . Heart failure Mother   . Diabetes Mother   . Hyperlipidemia Father   . Hypertension Father   . Cancer Father        Colon  . Hypertension Sister   . Hyperlipidemia Sister   . Heart Problems Sister   . Hypertrophic cardiomyopathy Sister   . Hypertension Brother   . Breast cancer Neg Hx    Past Surgical History:  Procedure Laterality Date  . BREAST BIOPSY Left 2016  . BUNIONECTOMY    . GANGLION CYST EXCISION    . MYOMECTOMY  12/09/2011   Procedure: MYOMECTOMY;  Surgeon: Ena Dawley, MD;  Location: [facility];  Service: Gynecology;  Laterality: N/A;  Abdominal Myomectomy, Biopsy abdominal Myometrium   Social History   Social History Narrative  . Not on file   Immunization History  Administered Date(s) Administered  . PFIZER(Purple Top)SARS-COV-2 Vaccination 05/16/2020, 06/06/2020     Objective: Vital Signs: LMP 12/27/2013    Physical Exam  Musculoskeletal Exam: ***  CDAI Exam: CDAI Score: -- Patient Global: --; Provider Global: -- Swollen: --; Tender: -- Joint Exam 06/18/2022   No joint exam has been documented for this visit   There is currently no information documented on the homunculus. Go to the Rheumatology activity and complete the homunculus joint exam.  Investigation: No additional findings.  Imaging: No results found.  Recent Labs: Lab Results  Component Value Date   WBC 6.5 05/17/2021   HGB 11.6 (L)  05/17/2021   PLT 243 05/17/2021   NA 141 05/17/2021   K 3.1 (L) 05/17/2021   CL 105 05/17/2021   CO2 25 05/17/2021   GLUCOSE 116 (H) 05/17/2021   BUN 13 05/17/2021   CREATININE 0.74 05/17/2021   BILITOT 0.3 01/22/2021   ALKPHOS 49 01/19/2017   AST 15 01/22/2021   ALT 19 01/22/2021   PROT 6.9 01/22/2021   ALBUMIN 4.2 01/19/2017   CALCIUM 9.1 05/17/2021   GFRAA 104 01/22/2021    Speciality Comments: PLQ Eye Exam: 07/22/2021 [facility] Follow up in 1 year    Procedures:  No procedures performed Allergies: Latex, Fluticasone, and Penicillins   Assessment / Plan:     Visit Diagnoses: No diagnosis found.  Orders: No orders of the defined types were placed in this encounter.  No orders of the defined types were placed in this encounter.   Face-to-face time spent with patient was *** minutes. Greater than 50% of time was spent in counseling and coordination of care.  Follow-Up Instructions: No follow-ups on file.   Earnestine Mealing, CMA  Note - This record has been created using Editor, commissioning.  Chart creation errors have been sought, but may not always  have been located. Such creation errors do not reflect on  the standard of medical care.

## 2022-06-08 ENCOUNTER — Other Ambulatory Visit: Payer: Self-pay | Admitting: Rheumatology

## 2022-06-08 DIAGNOSIS — M0609 Rheumatoid arthritis without rheumatoid factor, multiple sites: Secondary | ICD-10-CM

## 2022-06-08 NOTE — Telephone Encounter (Signed)
Next Visit: 06/18/2022  Last Visit: 11/21/2021  Labs: 05/08/2022 Hgb 11.5   Hct 35.4   MCH 26.6   Baso % 1.2  Eye exam: 07/22/2021    Current Dose per office note 11/21/2021: PLQ 200 mg BID M-F  OC:[id number] arthritis of multiple sites with negative rheumatoid factor   Last Fill: 03/16/2022  Okay to refill Plaquenil?

## 2022-06-10 ENCOUNTER — Ambulatory Visit: Payer: Self-pay

## 2022-06-18 ENCOUNTER — Ambulatory Visit: Payer: BC Managed Care – PPO | Admitting: Rheumatology

## 2022-06-18 DIAGNOSIS — M19071 Primary osteoarthritis, right ankle and foot: Secondary | ICD-10-CM

## 2022-06-18 DIAGNOSIS — E782 Mixed hyperlipidemia: Secondary | ICD-10-CM

## 2022-06-18 DIAGNOSIS — F5101 Primary insomnia: Secondary | ICD-10-CM

## 2022-06-18 DIAGNOSIS — Z79899 Other long term (current) drug therapy: Secondary | ICD-10-CM

## 2022-06-18 DIAGNOSIS — R5383 Other fatigue: Secondary | ICD-10-CM

## 2022-06-18 DIAGNOSIS — E559 Vitamin D deficiency, unspecified: Secondary | ICD-10-CM

## 2022-06-18 DIAGNOSIS — Z8679 Personal history of other diseases of the circulatory system: Secondary | ICD-10-CM

## 2022-06-18 DIAGNOSIS — M7062 Trochanteric bursitis, left hip: Secondary | ICD-10-CM

## 2022-06-18 DIAGNOSIS — M19041 Primary osteoarthritis, right hand: Secondary | ICD-10-CM

## 2022-06-18 DIAGNOSIS — M0609 Rheumatoid arthritis without rheumatoid factor, multiple sites: Secondary | ICD-10-CM

## 2022-06-26 IMAGING — US US EXTREM LOW VENOUS*L*
1 series · 13 of 24 positions shown · non-contrast
Comparison: None Available.

CLINICAL DATA: Left lower extremity pain and edema for the past
week. Evaluate for DVT.



[Series 1: us venous img lower uni left (dvt) · portal-venous · 13 of 44 slices shown]
[im 1/44]
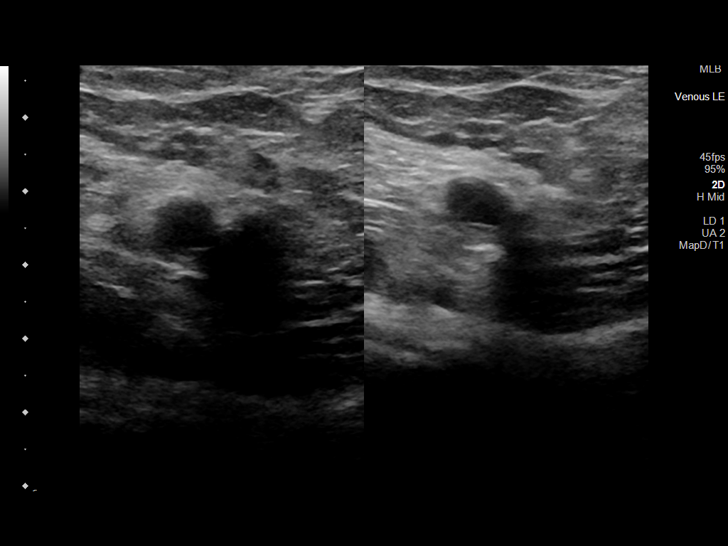
[im 4/44]
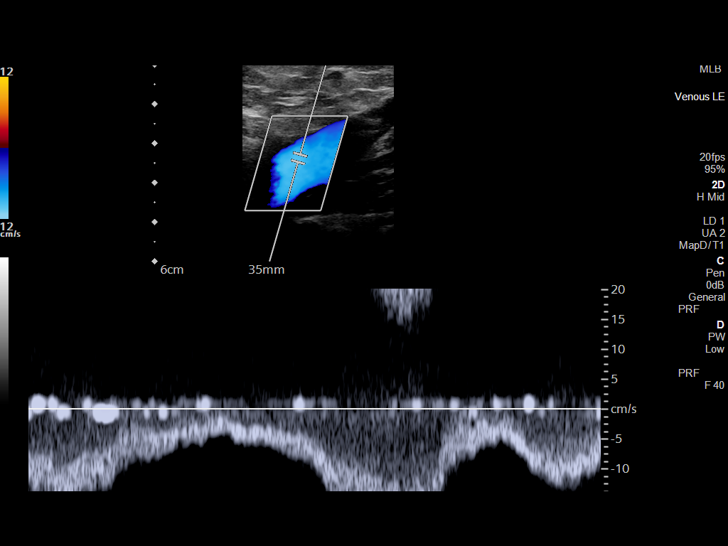
[im 8/44]
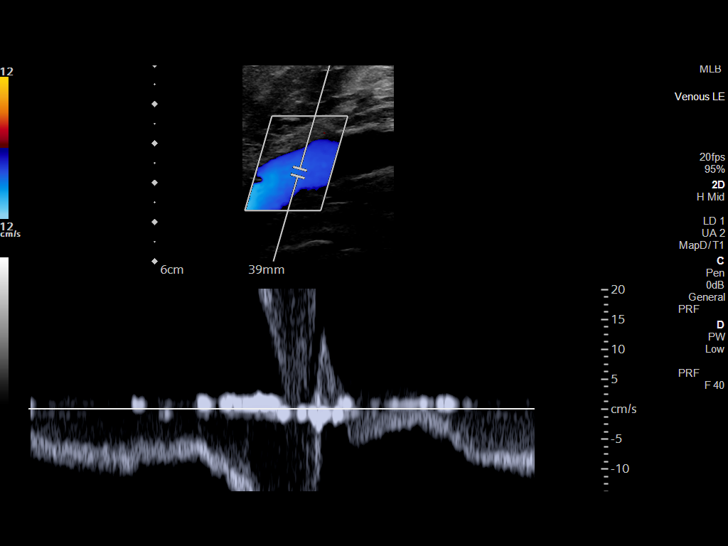
[im 12/44]
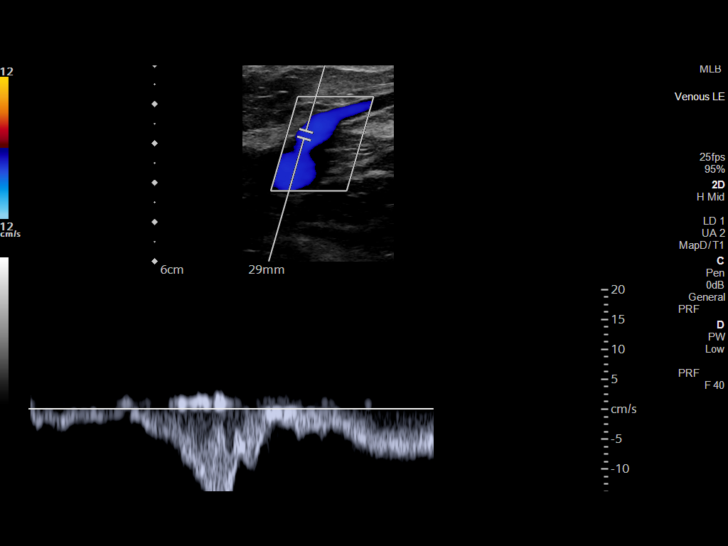
[im 15/44]
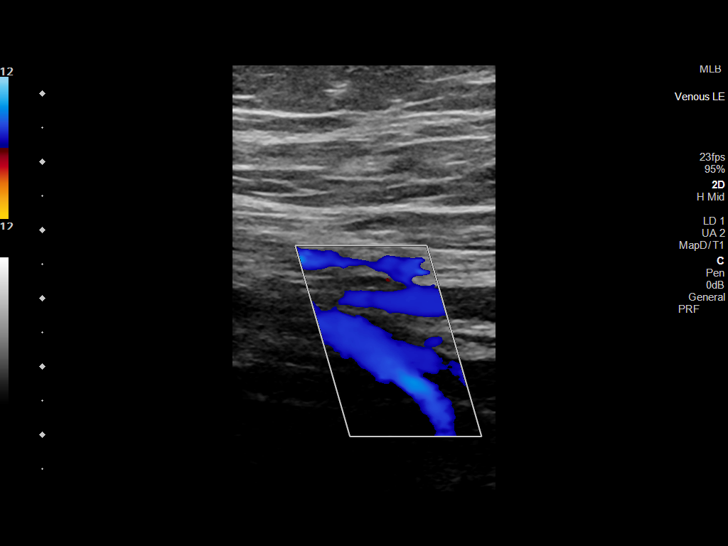
[im 19/44]
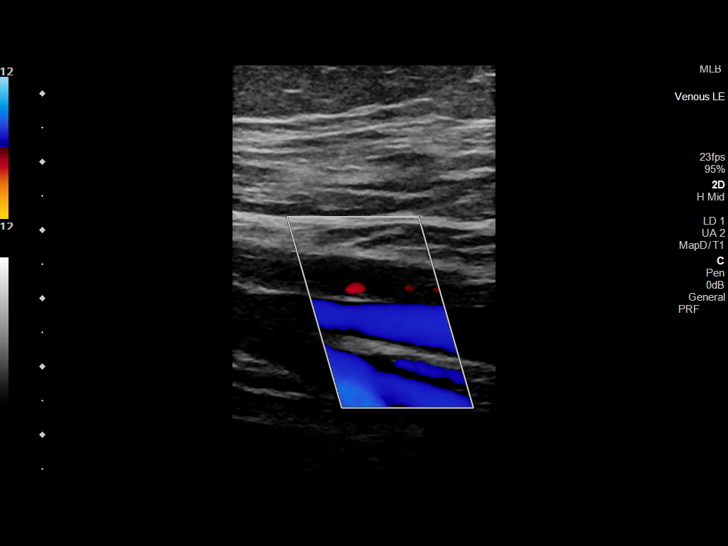
[im 23/44]
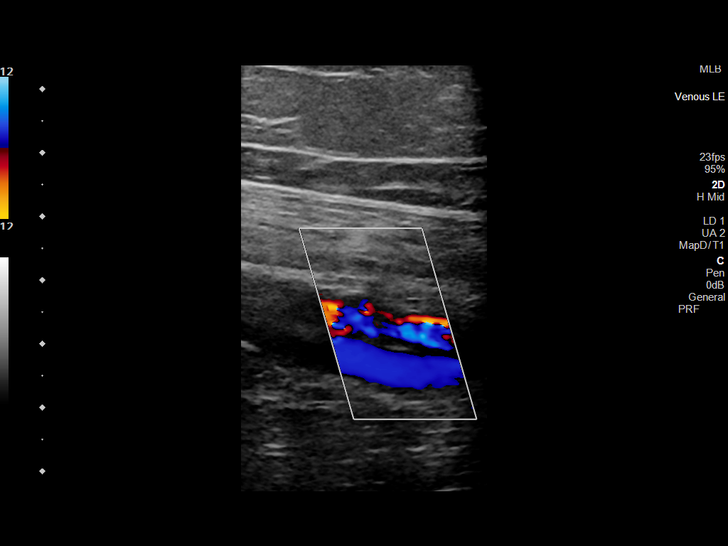
[im 25/44]
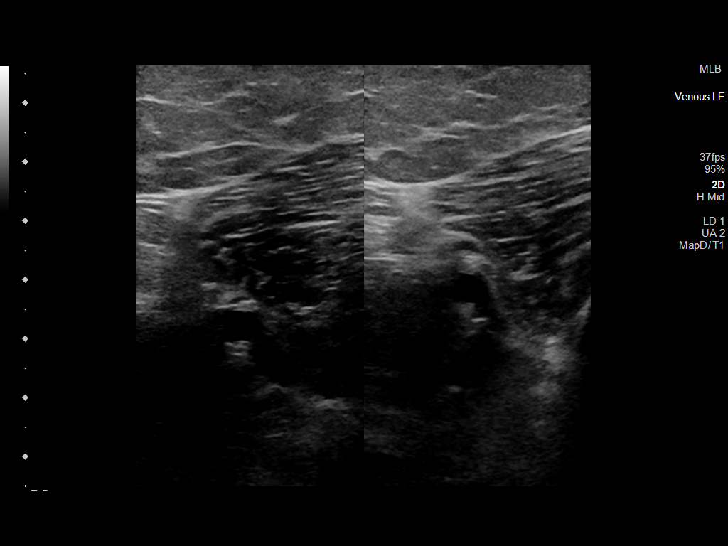
[im 29/44]
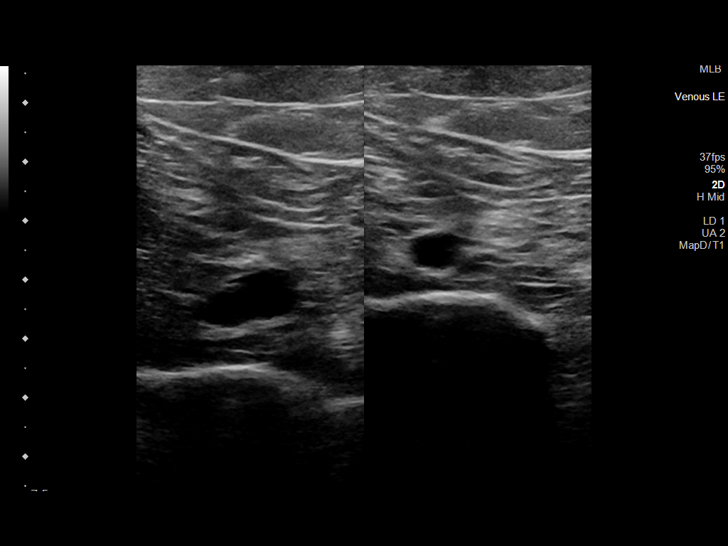
[im 32/44]
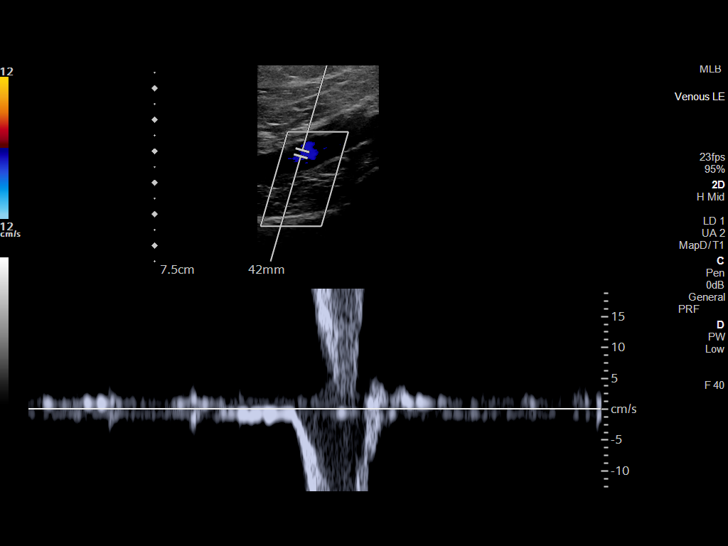
[im 36/44]
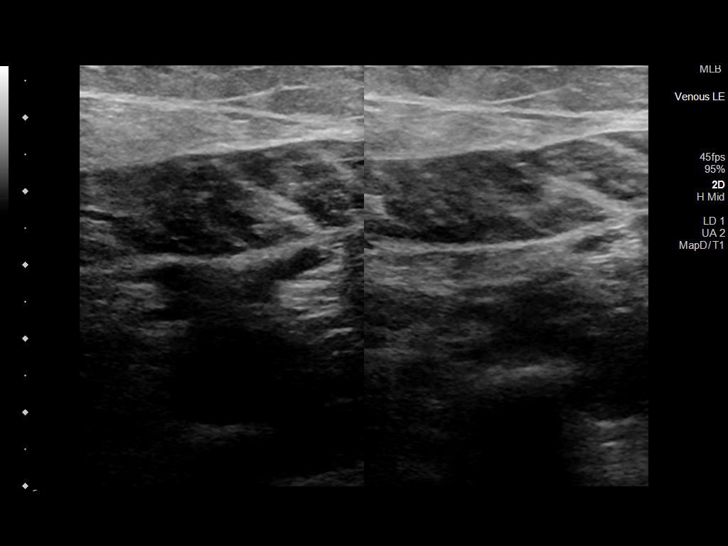
[im 40/44]
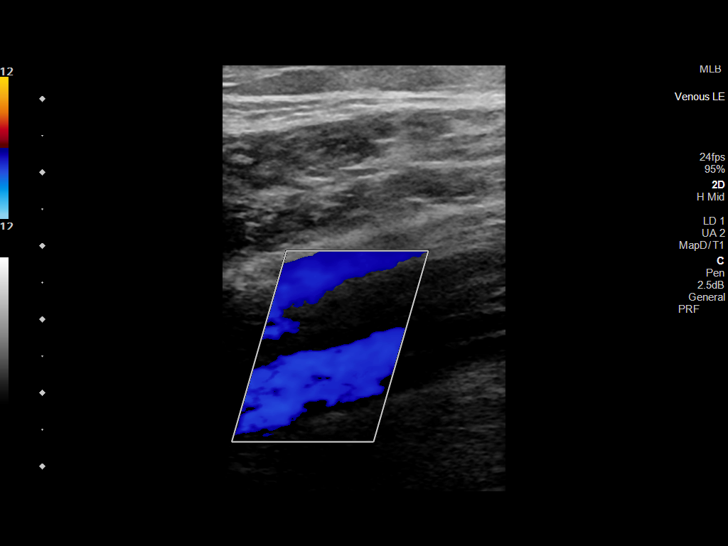
[im 44/44]
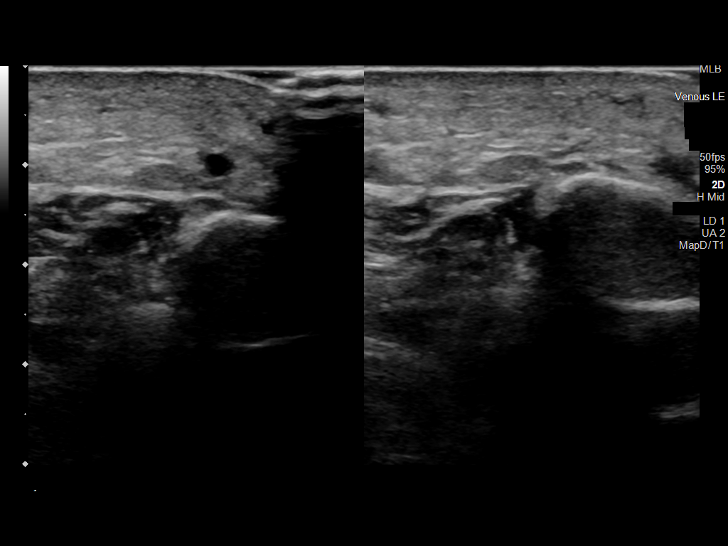

[13 of 24 positions shown; findings below may reference images not displayed]

FINDINGS: Contralateral Common Femoral Vein: Respiratory phasicity is normal
and symmetric with the symptomatic side. No evidence of thrombus.
Normal compressibility.

Common Femoral Vein: No evidence of thrombus. Normal
compressibility, respiratory phasicity and response to augmentation.

Saphenofemoral Junction: No evidence of thrombus. Normal
compressibility and flow on color Doppler imaging.

Profunda Femoral Vein: No evidence of thrombus. Normal
compressibility and flow on color Doppler imaging.

Femoral Vein: No evidence of thrombus. Normal compressibility,
respiratory phasicity and response to augmentation.

Popliteal Vein: No evidence of thrombus. Normal compressibility,
respiratory phasicity and response to augmentation.

Calf Veins: No evidence of thrombus. Normal compressibility and flow
on color Doppler imaging.

Superficial Great Saphenous Vein: No evidence of thrombus. Normal
compressibility.

Other Findings:  None.
IMPRESSION: No evidence of DVT within the left lower extremity.

## 2022-07-03 ENCOUNTER — Ambulatory Visit: Payer: Self-pay
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
## 2022-07-10 ENCOUNTER — Ambulatory Visit: Payer: BC Managed Care – PPO

## 2022-07-10 ENCOUNTER — Ambulatory Visit
Admission: RE | Admit: 2022-07-10 | Discharge: 2022-07-10 | Disposition: A | Discharge status: Home / Self Care | Payer: BC Managed Care – PPO | Source: Ambulatory Visit | Attending: Family Medicine | Admitting: Family Medicine

## 2022-07-10 DIAGNOSIS — Z1231 Encounter for screening mammogram for malignant neoplasm of breast: Secondary | ICD-10-CM

## 2022-07-16 ENCOUNTER — Ambulatory Visit (INDEPENDENT_AMBULATORY_CARE_PROVIDER_SITE_OTHER): Payer: BC Managed Care – PPO

## 2022-07-16 ENCOUNTER — Ambulatory Visit: Payer: BC Managed Care – PPO | Attending: Rheumatology | Admitting: Physician Assistant

## 2022-07-16 ENCOUNTER — Encounter: Payer: Self-pay | Admitting: Physician Assistant

## 2022-07-16 VITALS — BP 104/68 | HR 82 | Ht 62.0 in | Wt 184.2 lb

## 2022-07-16 DIAGNOSIS — M19042 Primary osteoarthritis, left hand: Secondary | ICD-10-CM

## 2022-07-16 DIAGNOSIS — E559 Vitamin D deficiency, unspecified: Secondary | ICD-10-CM

## 2022-07-16 DIAGNOSIS — E782 Mixed hyperlipidemia: Secondary | ICD-10-CM

## 2022-07-16 DIAGNOSIS — M25552 Pain in left hip: Secondary | ICD-10-CM

## 2022-07-16 DIAGNOSIS — M0609 Rheumatoid arthritis without rheumatoid factor, multiple sites: Secondary | ICD-10-CM | POA: Diagnosis not present

## 2022-07-16 DIAGNOSIS — Z8679 Personal history of other diseases of the circulatory system: Secondary | ICD-10-CM

## 2022-07-16 DIAGNOSIS — F5101 Primary insomnia: Secondary | ICD-10-CM

## 2022-07-16 DIAGNOSIS — Z79899 Other long term (current) drug therapy: Secondary | ICD-10-CM | POA: Diagnosis not present

## 2022-07-16 DIAGNOSIS — M19071 Primary osteoarthritis, right ankle and foot: Secondary | ICD-10-CM | POA: Diagnosis not present

## 2022-07-16 DIAGNOSIS — M19041 Primary osteoarthritis, right hand: Secondary | ICD-10-CM

## 2022-07-16 DIAGNOSIS — M7062 Trochanteric bursitis, left hip: Secondary | ICD-10-CM

## 2022-07-16 DIAGNOSIS — R5383 Other fatigue: Secondary | ICD-10-CM

## 2022-07-16 DIAGNOSIS — M19072 Primary osteoarthritis, left ankle and foot: Secondary | ICD-10-CM

## 2022-07-16 DIAGNOSIS — M7061 Trochanteric bursitis, right hip: Secondary | ICD-10-CM

## 2022-07-16 NOTE — Progress Notes (Signed)
X-rays of the left hip are consistent with moderate to severe osteoarthritis.   ESR and CRP are pending.  If levels are normal and she has persistent discomfort Dr. Estanislado Pandy recommends seeing orthopedics.

## 2022-07-16 NOTE — Patient Instructions (Signed)
Standing Labs We placed an order today for your standing lab work.   Please have your standing labs drawn in January and every 5 months   Please have your labs drawn 2 weeks prior to your appointment so that the provider can discuss your lab results at your appointment.  Please note that you may see your imaging and lab results in [place] before we have reviewed them. We will contact you once all results are reviewed. Please allow our office up to 72 hours to thoroughly review all of the results before contacting the office for clarification of your results.  Lab hours are:   Monday through Thursday from 8:00 am -12:30 pm and 1:00 pm-5:00 pm and Friday from 8:00 am-12:00 pm.  Please be advised, all patients with office appointments requiring lab work will take precedent over walk-in lab work.   Labs are drawn by [facility]. Please bring your co-pay at the time of your lab draw.  You may receive a bill from [facility] for your lab work.  Please note if you are on Hydroxychloroquine and and an order has been placed for a Hydroxychloroquine level, you will need to have it drawn 4 hours or more after your last dose.  If you wish to have your labs drawn at another location, please call the office 24 hours in advance so we can fax the orders.  The office is located at [address], [place], [place], [place] [address] No appointment is necessary.    If you have any questions regarding directions or hours of operation,  please call [phone].   As a reminder, please drink plenty of water prior to coming for your lab work. Thanks!

## 2022-07-17 ENCOUNTER — Telehealth: Payer: Self-pay | Admitting: *Deleted

## 2022-07-17 DIAGNOSIS — M7061 Trochanteric bursitis, right hip: Secondary | ICD-10-CM

## 2022-07-17 DIAGNOSIS — S60811A Abrasion of right wrist, initial encounter: Secondary | ICD-10-CM | POA: Diagnosis not present

## 2022-07-17 DIAGNOSIS — M1612 Unilateral primary osteoarthritis, left hip: Secondary | ICD-10-CM | POA: Diagnosis not present

## 2022-07-17 DIAGNOSIS — M25552 Pain in left hip: Secondary | ICD-10-CM

## 2022-07-17 DIAGNOSIS — L905 Scar conditions and fibrosis of skin: Secondary | ICD-10-CM | POA: Diagnosis not present

## 2022-07-17 LAB — C-REACTIVE PROTEIN: CRP: 1.8 mg/L (ref <8.0)

## 2022-07-17 LAB — SEDIMENTATION RATE: Sed Rate: 19 mm/h (ref 0–30)

## 2022-07-17 NOTE — Progress Notes (Signed)
ESR and CRP WNL. Please offer referral to orthopedics.

## 2022-07-17 NOTE — Telephone Encounter (Signed)
-----  Message from Ofilia Neas, PA-C sent at 07/17/2022  8:03 AM EDT ----- ESR and CRP WNL. Please offer referral to orthopedics.

## 2022-07-21 ENCOUNTER — Ambulatory Visit: Payer: BC Managed Care – PPO | Admitting: Orthopaedic Surgery

## 2022-07-28 NOTE — Progress Notes (Deleted)
Office Visit    Patient Name: Stacey Turner Date of Encounter: 07/28/2022  Primary Care Provider:  [name], MD Primary Cardiologist:  Candee Furbish, MD Primary Electrophysiologist: None  Chief Complaint    LOVELY KERINS is a 61 y.o. female with PMH of HTN, palpitations, hyperlipidemia, rheumatoid arthritis who presents today for 44-monthfollow-up.  Past Medical History    Past Medical History:  Diagnosis Date   Anxiety    Cardiac murmur    Class 2 severe obesity due to excess calories with serious comorbidity in adult, unspecified BMI ([facility])    Elevated white blood cell count    Fibroid uterus 2001   Fibroids    Grief    H. 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   Hyperlipidemia    Hypertension    Irregular periods/menstrual cycles 1999   LVH (left ventricular hypertrophy) due to hypertensive disease    Monilial vaginitis 2003   Palpitations    Pelvic pain in female 11/25/2011   PONV (postoperative nausea and vomiting)    [place]    Seasonal allergic rhinitis    Sinusitis 2009   Situational stress    Stress 2004   Vitamin D deficiency    Yeast vaginitis 2004   Past Surgical History:  Procedure Laterality Date   BREAST BIOPSY Left 2016   BUNIONECTOMY     GANGLION CYST EXCISION     MYOMECTOMY  12/09/2011   Procedure: MYOMECTOMY;  Surgeon: AEna Dawley MD;  [facility];  Service: Gynecology;  Laterality: N/A;  Abdominal Myomectomy, Biopsy abdominal Myometrium    Allergies  Allergies  Allergen Reactions   Latex Hives   Fluticasone     Other reaction(s): headaches   Penicillins     History of Present Illness    Stacey Turner  is a 61year old female with the above mention past medical history who presents today for follow-up of chest pain and hypertension.  She was  initially seen by Dr. BGwenlyn Foundin 2015 for complaint of chest pain palpitation.  She was noted to have cardiac murmur and 2D echo showed normal LV systolic function with severe focal basal and mild concentric LVH.  She wore an event monitor that was normal previously.  She was last seen by Dr. SMarlou Porch1/2023 for evaluation of chest pain and palpitations.  She wore a Zio patch that revealed sinus rhythm with no A-fib or pauses and rare PACs and PVCs.  She underwent Lexiscan stress test that was low risk and showed no evidence of ischemia.  She was recommended to have MRI due to sisters history of HOCM.  Since last being seen in the office patient reports***.  Patient denies chest pain, palpitations, dyspnea, PND, orthopnea, nausea, vomiting, dizziness, syncope, edema, weight gain, or early satiety.   ***Notes:  Home Medications    Current Outpatient Medications  Medication Sig Dispense Refill   enalapril (VASOTEC) 20 MG tablet Take 20 mg by mouth daily.     fluticasone (FLONASE) 50 MCG/ACT nasal spray Place into both nostrils every other day.     hydroxychloroquine (PLAQUENIL) 200 MG tablet TAKE 1 TABLET TWICE A DAY MONDAY THROUGH FRIDAY ONLY 120 tablet 0   ibuprofen (ADVIL) 600 MG tablet Take 1 tablet (600 mg total) by mouth every  6 (six) hours as needed. 30 tablet 0   montelukast (SINGULAIR) 10 MG tablet Take 10 mg by mouth at bedtime.     mupirocin ointment (BACTROBAN) 2 % Apply 1 Application topically 2 (two) times daily. 22 g 0   pravastatin (PRAVACHOL) 40 MG tablet Take 40 mg by mouth every morning.     triamcinolone (NASACORT) 55 MCG/ACT nasal inhaler Place 1 spray into the nose daily. Alternates with Astelin     triamcinolone cream (KENALOG) 0.5 % APPLY SPARINGLY TO AFFECTED AREA TWICE A DAY EXTERNALLY 14 DAYS     VITAMIN D PO Take by mouth daily.     Vitamin D, Ergocalciferol, (DRISDOL) 1.25 MG (50000 UNIT) CAPS capsule Take 1 capsule (50,000 Units total) by mouth every 7 (seven) days.  (Patient not taking: Reported on 07/16/2022) 12 capsule 0   No current facility-administered medications for this visit.     Review of Systems  Please see the history of present illness.    (+)*** (+)***  All other systems reviewed and are otherwise negative except as noted above.  Physical Exam    Wt Readings from Last 3 Encounters:  07/16/22 184 lb 3.2 oz (83.6 kg)  11/21/21 206 lb 6.4 oz (93.6 kg)  10/30/21 206 lb (93.4 kg)   NW:GNFAO were no vitals filed for this visit.,There is no height or weight on file to calculate BMI.  Constitutional:      Appearance: Healthy appearance. Not in distress.  Neck:     Vascular: JVD normal.  Pulmonary:     Effort: Pulmonary effort is normal.     Breath sounds: No wheezing. No rales. Diminished in the bases Cardiovascular:     Normal rate. Regular rhythm. Normal S1. Normal S2.      Murmurs: There is no murmur.  Edema:    Peripheral edema absent.  Abdominal:     Palpations: Abdomen is soft non tender. There is no hepatomegaly.  Skin:    General: Skin is warm and dry.  Neurological:     General: No focal deficit present.     Mental Status: Alert and oriented to person, place and time.     Cranial Nerves: Cranial nerves are intact.  EKG/LABS/Other Studies Reviewed    ECG personally reviewed by me today - ***  Risk Assessment/Calculations:   {Does this patient have ATRIAL FIBRILLATION?:[phone]}        Lab Results  Component Value Date   WBC 6.5 05/17/2021   HGB 11.6 (L) 05/17/2021   HCT 35.7 (L) 05/17/2021   MCV 82.4 05/17/2021   PLT 243 05/17/2021   Lab Results  Component Value Date   CREATININE 0.74 05/17/2021   BUN 13 05/17/2021   NA 141 05/17/2021   K 3.1 (L) 05/17/2021   CL 105 05/17/2021   CO2 25 05/17/2021   Lab Results  Component Value Date   ALT 19 01/22/2021   AST 15 01/22/2021   ALKPHOS 49 01/19/2017   BILITOT 0.3 01/22/2021   No results found for: "CHOL", "HDL", "LDLCALC", "LDLDIRECT", "TRIG",  "CHOLHDL"  No results found for: "HGBA1C"  Assessment & Plan    1.  Atypical chest pain: -Patient underwent Lexiscan Myoview that showed low risk with no ischemia. -Today patient reports***  2.  History of palpitations: -ZIO monitor worn that showed no evidence of AF, pauses, PACs/PVCs -Today she reports***  3.  Essential hypertension: -Patient's blood pressure today was*** -Continue enalapril 20 mg  4.  Hyperlipidemia: -Patient's last LDL was*** -Continue  pravastatin 40 mg daily      Disposition: Follow-up with Candee Furbish, MD or APP in *** months {Are you ordering a CV Procedure (e.g. stress test, cath, DCCV, TEE, etc)?   Press F2        :[id number]}   Medication Adjustments/Labs and Tests Ordered: Current medicines are reviewed at length with the patient today.  Concerns regarding medicines are outlined above.   Signed, Mable Fill, Marissa Nestle, NP 07/28/2022, 8:58 AM [facility]  Note:  This document was prepared using Dragon voice recognition software and may include unintentional dictation errors.

## 2022-07-30 ENCOUNTER — Ambulatory Visit: Payer: BC Managed Care – PPO | Admitting: Nurse Practitioner

## 2022-07-30 DIAGNOSIS — R002 Palpitations: Secondary | ICD-10-CM

## 2022-08-05 DIAGNOSIS — M1612 Unilateral primary osteoarthritis, left hip: Secondary | ICD-10-CM | POA: Diagnosis not present

## 2022-08-05 DIAGNOSIS — R21 Rash and other nonspecific skin eruption: Secondary | ICD-10-CM | POA: Diagnosis not present

## 2022-08-05 DIAGNOSIS — Z23 Encounter for immunization: Secondary | ICD-10-CM | POA: Diagnosis not present

## 2022-08-05 DIAGNOSIS — L905 Scar conditions and fibrosis of skin: Secondary | ICD-10-CM | POA: Diagnosis not present

## 2022-08-07 DIAGNOSIS — Z79899 Other long term (current) drug therapy: Secondary | ICD-10-CM | POA: Diagnosis not present

## 2022-08-07 DIAGNOSIS — H25813 Combined forms of age-related cataract, bilateral: Secondary | ICD-10-CM | POA: Diagnosis not present

## 2022-08-07 DIAGNOSIS — H04123 Dry eye syndrome of bilateral lacrimal glands: Secondary | ICD-10-CM | POA: Diagnosis not present

## 2022-10-09 ENCOUNTER — Ambulatory Visit: Payer: BC Managed Care – PPO | Admitting: Cardiology

## 2022-12-09 ENCOUNTER — Ambulatory Visit: Payer: BC Managed Care – PPO | Admitting: Rheumatology

## 2022-12-16 NOTE — Progress Notes (Deleted)
Office Visit Note  Patient: Stacey Turner             Date of Birth: 1961-01-04           MRN: [id number]             PCP: [name], MD Referring: [name], MD Visit Date: 12/30/2022 Occupation: @GUAROCC @  Subjective:  No chief complaint on file.   History of Present Illness: Stacey Turner is a 62 y.o. female ***     Activities of Daily Living:  Patient reports morning stiffness for *** {minute/hour:19697}.   Patient {ACTIONS;DENIES/REPORTS:21021675::"Denies"} nocturnal pain.  Difficulty dressing/grooming: {ACTIONS;DENIES/REPORTS:21021675::"Denies"} Difficulty climbing stairs: {ACTIONS;DENIES/REPORTS:21021675::"Denies"} Difficulty getting out of chair: {ACTIONS;DENIES/REPORTS:21021675::"Denies"} Difficulty using hands for taps, buttons, cutlery, and/or writing: {ACTIONS;DENIES/REPORTS:21021675::"Denies"}  No Rheumatology ROS completed.   PMFS History:  Patient Active Problem List   Diagnosis Date Noted   Chest pain of uncertain etiology [id number]   Primary insomnia 01/16/2017   Rheumatoid arthritis of multiple sites with negative rheumatoid factor ([place]) 01/05/2017   High risk medication use 01/05/2017   Trochanteric bursitis of both hips 01/05/2017   Palpitations 10/06/2013   Cardiac murmur 10/06/2013   Essential hypertension 10/06/2013   Hyperlipidemia 10/06/2013   S/P myomectomy 03/14/2012   Obesity 01/19/2012   Fibroids 01/19/2012   Pelvic pain 01/19/2012    Past Medical History:  Diagnosis Date   Anxiety    Cardiac murmur    Class 2 severe obesity due to excess calories with serious comorbidity in adult, unspecified BMI ([place])    Elevated white blood cell count    Fibroid uterus 2001   Fibroids    Grief    H. 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   Hyperlipidemia    Hypertension     Irregular periods/menstrual cycles 1999   LVH (left ventricular hypertrophy) due to hypertensive disease    Monilial vaginitis 2003   Palpitations    Pelvic pain in female 11/25/2011   PONV (postoperative nausea and vomiting)    Rheumatoid arthritis ([place])    Seasonal allergic rhinitis    Sinusitis 2009   Situational stress    Stress 2004   Vitamin D deficiency    Yeast vaginitis 2004    Family History  Problem Relation Age of Onset   Hyperlipidemia Mother    Hypertension Mother    Heart failure Mother    Diabetes Mother    Hyperlipidemia Father    Hypertension Father    Cancer Father        Colon   Hypertension Sister    Hyperlipidemia Sister    Heart Problems Sister    Hypertrophic cardiomyopathy Sister    Hypertension Brother    Breast cancer Neg Hx    Past Surgical History:  Procedure Laterality Date   BREAST BIOPSY Left 2016   BUNIONECTOMY     GANGLION CYST EXCISION     MYOMECTOMY  12/09/2011   Procedure: MYOMECTOMY;  Surgeon: Ena Dawley, MD;  Location: [facility];  Service: Gynecology;  Laterality: N/A;  Abdominal Myomectomy, Biopsy abdominal Myometrium   Social History   Social History Narrative   Not on file   Immunization History  Administered Date(s) Administered   PFIZER(Purple Top)SARS-COV-2 Vaccination 05/16/2020, 06/06/2020     Objective: Vital Signs: LMP 12/27/2013    Physical Exam  Musculoskeletal Exam: ***  CDAI Exam: CDAI Score: -- Patient Global: --; Provider Global: -- Swollen: --; Tender: -- Joint Exam 12/30/2022   No joint exam has been documented for this visit   There is currently no information documented on the homunculus. Go to the Rheumatology activity and complete the homunculus joint exam.  Investigation: No additional findings.  Imaging: No results found.  Recent Labs: Lab Results  Component Value Date   WBC 6.5 05/17/2021   HGB 11.6 (L) 05/17/2021   PLT 243 05/17/2021   NA 141 05/17/2021   K 3.1 (L)  05/17/2021   CL 105 05/17/2021   CO2 25 05/17/2021   GLUCOSE 116 (H) 05/17/2021   BUN 13 05/17/2021   CREATININE 0.74 05/17/2021   BILITOT 0.3 01/22/2021   ALKPHOS 49 01/19/2017   AST 15 01/22/2021   ALT 19 01/22/2021   PROT 6.9 01/22/2021   ALBUMIN 4.2 01/19/2017   CALCIUM 9.1 05/17/2021   GFRAA 104 01/22/2021    Speciality Comments: PLQ Eye Exam: 08/07/2022 [facility] Follow up in 1 year    Procedures:  No procedures performed Allergies: Latex, Fluticasone, and Penicillins   Assessment / Plan:     Visit Diagnoses: Rheumatoid arthritis of multiple sites with negative rheumatoid factor (HCC)  High risk medication use  Primary osteoarthritis of both hands  Unilateral primary osteoarthritis, left hip  Trochanteric bursitis of both hips  Primary osteoarthritis of both feet  Vitamin D deficiency  Other fatigue  Primary insomnia  History of hypertension  Mixed hyperlipidemia  Orders: No orders of the defined types were placed in this encounter.  No orders of the defined types were placed in this encounter.   Face-to-face time spent with patient was *** minutes. Greater than 50% of time was spent in counseling and coordination of care.  Follow-Up Instructions: No follow-ups on file.   Ofilia Neas, PA-C  Note - This record has been created using Dragon software.  Chart creation errors have been sought, but may not always  have been located. Such creation errors do not reflect on  the standard of medical care.

## 2022-12-30 ENCOUNTER — Ambulatory Visit: Payer: Self-pay | Admitting: Rheumatology

## 2022-12-30 DIAGNOSIS — M7061 Trochanteric bursitis, right hip: Secondary | ICD-10-CM

## 2022-12-30 DIAGNOSIS — R5383 Other fatigue: Secondary | ICD-10-CM

## 2022-12-30 DIAGNOSIS — M0609 Rheumatoid arthritis without rheumatoid factor, multiple sites: Secondary | ICD-10-CM

## 2022-12-30 DIAGNOSIS — E559 Vitamin D deficiency, unspecified: Secondary | ICD-10-CM

## 2022-12-30 DIAGNOSIS — F5101 Primary insomnia: Secondary | ICD-10-CM

## 2022-12-30 DIAGNOSIS — E782 Mixed hyperlipidemia: Secondary | ICD-10-CM

## 2022-12-30 DIAGNOSIS — M1612 Unilateral primary osteoarthritis, left hip: Secondary | ICD-10-CM

## 2022-12-30 DIAGNOSIS — M19072 Primary osteoarthritis, left ankle and foot: Secondary | ICD-10-CM

## 2022-12-30 DIAGNOSIS — Z79899 Other long term (current) drug therapy: Secondary | ICD-10-CM

## 2022-12-30 DIAGNOSIS — Z8679 Personal history of other diseases of the circulatory system: Secondary | ICD-10-CM

## 2022-12-30 DIAGNOSIS — M19041 Primary osteoarthritis, right hand: Secondary | ICD-10-CM

## 2023-01-14 ENCOUNTER — Encounter: Payer: Self-pay | Admitting: Rheumatology

## 2023-01-14 ENCOUNTER — Ambulatory Visit: Payer: Self-pay | Attending: Rheumatology | Admitting: Rheumatology

## 2023-01-14 VITALS — BP 129/80 | HR 66 | Resp 16 | Ht 62.0 in | Wt 169.0 lb

## 2023-01-14 DIAGNOSIS — Z8679 Personal history of other diseases of the circulatory system: Secondary | ICD-10-CM

## 2023-01-14 DIAGNOSIS — R5383 Other fatigue: Secondary | ICD-10-CM

## 2023-01-14 DIAGNOSIS — E559 Vitamin D deficiency, unspecified: Secondary | ICD-10-CM

## 2023-01-14 DIAGNOSIS — M19071 Primary osteoarthritis, right ankle and foot: Secondary | ICD-10-CM

## 2023-01-14 DIAGNOSIS — Z79899 Other long term (current) drug therapy: Secondary | ICD-10-CM

## 2023-01-14 DIAGNOSIS — M7062 Trochanteric bursitis, left hip: Secondary | ICD-10-CM

## 2023-01-14 DIAGNOSIS — M0609 Rheumatoid arthritis without rheumatoid factor, multiple sites: Secondary | ICD-10-CM

## 2023-01-14 DIAGNOSIS — E782 Mixed hyperlipidemia: Secondary | ICD-10-CM

## 2023-01-14 DIAGNOSIS — M19072 Primary osteoarthritis, left ankle and foot: Secondary | ICD-10-CM

## 2023-01-14 DIAGNOSIS — M7061 Trochanteric bursitis, right hip: Secondary | ICD-10-CM

## 2023-01-14 DIAGNOSIS — F5101 Primary insomnia: Secondary | ICD-10-CM

## 2023-01-14 DIAGNOSIS — M1612 Unilateral primary osteoarthritis, left hip: Secondary | ICD-10-CM

## 2023-01-14 DIAGNOSIS — M19041 Primary osteoarthritis, right hand: Secondary | ICD-10-CM

## 2023-01-14 DIAGNOSIS — M19042 Primary osteoarthritis, left hand: Secondary | ICD-10-CM

## 2023-01-14 MED ORDER — HYDROXYCHLOROQUINE SULFATE 200 MG PO TABS
ORAL_TABLET | ORAL | 0 refills | Status: DC
Start: 2023-01-14 — End: 2023-05-18

## 2023-01-14 NOTE — Patient Instructions (Addendum)
Vaccines °You are taking a medication(s) that can suppress your immune system.  The following immunizations are recommended: °Flu annually °Covid-19  °Td/Tdap (tetanus, diphtheria, pertussis) every 10 years °Pneumonia (Prevnar 15 then Pneumovax 23 at least 1 year apart.  Alternatively, can take Prevnar 20 without needing additional dose) °Shingrix: 2 doses from 4 weeks to 6 months apart ° °Please check with your PCP to make sure you are up to date.  ° °Heart Disease Prevention  ° °Your inflammatory disease increases your risk of heart disease which includes heart attack, stroke, atrial fibrillation (irregular heartbeats), high blood pressure, heart failure and atherosclerosis (plaque in the arteries).  It is important to reduce your risk by:  ° °Keep blood pressure, cholesterol, and blood sugar at healthy levels  ° °Smoking Cessation  ° °Maintain a healthy weight  °BMI 20-25  ° °Eat a healthy diet  °Plenty of fresh fruit, vegetables, and whole grains  °Limit saturated fats, foods high in sodium, and added sugars  °DASH and Mediterranean diet  ° °Increase physical activity  °Recommend moderate physically activity for 150 minutes per week/ 30 minutes a day for five days a week These can be broken up into three separate ten-minute sessions during the day.  ° °Reduce Stress  °Meditation, slow breathing exercises, yoga, coloring books  °Dental visits twice a year   °

## 2023-01-14 NOTE — Progress Notes (Signed)
Office Visit Note  Patient: Stacey Turner             Date of Birth: 1961/05/18           MRN: [id number]             PCP: Laurann Montana, MD Referring: Laurann Montana, MD Visit Date: 01/14/2023 Occupation: @  Subjective:  Pain in multiple joints  History of Present Illness: Stacey Turner is a 62 y.o. female with history of seronegative rheumatoid arthritis and osteoarthritis.  She continues to have pain and discomfort in her left hip and multiple other joints.  She states she could not take Plaquenil in the last month due to her father's illness.  She was commuting back and forth to [place] and did not have time to take care of herself.  She states she has been having increased pain and discomfort in all of her joints.  She complains of pain and discomfort in her bilateral hands, bilateral hips which she describes over the trochanteric region and bilateral knee joints.  She has not noticed any joint swelling.    Activities of Daily Living:  Patient reports morning stiffness for a few minutes.   Patient Denies nocturnal pain.  Difficulty dressing/grooming: Denies Difficulty climbing stairs: Denies Difficulty getting out of chair: Denies Difficulty using hands for taps, buttons, cutlery, and/or writing: Denies  Review of Systems  Constitutional:  Positive for fatigue.  HENT:  Negative for mouth sores and mouth dryness.   Eyes:  Negative for dryness.  Respiratory:  Negative for shortness of breath.   Cardiovascular:  Negative for chest pain and palpitations.  Gastrointestinal:  Negative for blood in stool, constipation and diarrhea.  Endocrine: Negative for increased urination.  Genitourinary:  Negative for involuntary urination.  Musculoskeletal:  Positive for joint pain, joint pain, myalgias, morning stiffness, muscle tenderness and myalgias. Negative for gait problem, joint swelling and muscle weakness.  Skin:  Negative for color change, rash, hair loss and  sensitivity to sunlight.  Allergic/Immunologic: Negative for susceptible to infections.  Neurological:  Negative for dizziness and headaches.  Hematological:  Negative for swollen glands.  Psychiatric/Behavioral:  Positive for sleep disturbance. Negative for depressed mood. The patient is not nervous/anxious.     PMFS History:  Patient Active Problem List   Diagnosis Date Noted   Chest pain of uncertain etiology 10/10/2021   Primary insomnia 01/16/2017   Rheumatoid arthritis of multiple sites with negative rheumatoid factor 01/05/2017   High risk medication use 01/05/2017   Trochanteric bursitis of both hips 01/05/2017   Palpitations 10/06/2013   Cardiac murmur 10/06/2013   Essential hypertension 10/06/2013   Hyperlipidemia 10/06/2013   S/P myomectomy 03/14/2012   Obesity 01/19/2012   Fibroids 01/19/2012   Pelvic pain 01/19/2012    Past Medical History:  Diagnosis Date   Anxiety    Cardiac murmur    Class 2 severe obesity due to excess calories with serious comorbidity in adult, unspecified BMI    Elevated white blood cell count    Fibroid uterus 2001   Fibroids    Grief    H. 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   Hyperlipidemia    Hypertension    Irregular periods/menstrual cycles 1999   LVH (left ventricular hypertrophy) due to hypertensive disease  Monilial vaginitis 2003   Palpitations    Pelvic pain in female 11/25/2011   PONV (postoperative nausea and vomiting)    Rheumatoid arthritis    Seasonal allergic rhinitis    Sinusitis 2009   Situational stress    Stress 2004   Vitamin D deficiency    Yeast vaginitis 2004    Family History  Problem Relation Age of Onset   Hyperlipidemia Mother    Hypertension Mother    Heart failure Mother    Diabetes Mother    Hyperlipidemia Father    Hypertension  Father    Cancer Father        Colon   Hypertension Sister    Hyperlipidemia Sister    Heart Problems Sister    Hypertrophic cardiomyopathy Sister    Hypertension Brother    Breast cancer Neg Hx    Past Surgical History:  Procedure Laterality Date   BREAST BIOPSY Left 2016   BUNIONECTOMY     GANGLION CYST EXCISION     MYOMECTOMY  12/09/2011   Procedure: MYOMECTOMY;  Surgeon: Kirkland Hun, MD;  Location: [facility];  Service: Gynecology;  Laterality: N/A;  Abdominal Myomectomy, Biopsy abdominal Myometrium   Social History   Social History Narrative   Not on file   Immunization History  Administered Date(s) Administered   PFIZER(Purple Top)SARS-COV-2 Vaccination 05/16/2020, 06/06/2020     Objective: Vital Signs: BP 129/80 (BP Location: Left Arm, Patient Position: Sitting, Cuff Size: Normal)   Pulse 66   Resp 16   Ht 5\' 2"  (1.575 m)   Wt 169 lb (76.7 kg)   LMP 12/27/2013   BMI 30.91 kg/m    Physical Exam Vitals and nursing note reviewed.  Constitutional:      Appearance: She is well-developed.  HENT:     Head: Normocephalic and atraumatic.  Eyes:     Conjunctiva/sclera: Conjunctivae normal.  Cardiovascular:     Rate and Rhythm: Normal rate and regular rhythm.     Heart sounds: Normal heart sounds.  Pulmonary:     Effort: Pulmonary effort is normal.     Breath sounds: Normal breath sounds.  Abdominal:     General: Bowel sounds are normal.     Palpations: Abdomen is soft.  Musculoskeletal:     Cervical back: Normal range of motion.  Lymphadenopathy:     Cervical: No cervical adenopathy.  Skin:    General: Skin is warm and dry.     Capillary Refill: Capillary refill takes less than 2 seconds.  Neurological:     Mental Status: She is alert and oriented to person, place, and time.  Psychiatric:        Behavior: Behavior normal.      Musculoskeletal Exam: Cervical spine with good range of motion.  Shoulder joints, elbow joints, wrist joints, MCPs PIPs and  DIPs with good range of motion.  She had bilateral PIP and DIP thickening.  No synovitis was noted.  She had painful range of motion of her left hip joint.  She had mild trochanteric tenderness.  Knee joints were in good range of motion without any warmth swelling or effusion.  There was no tenderness over ankles or MTPs.  CDAI Exam: CDAI Score: -- Patient Global: --; Provider Global: -- Swollen: --; Tender: -- Joint Exam 01/14/2023   No joint exam has been documented for this visit   There is currently no information documented on the homunculus. Go to the Rheumatology activity and complete the homunculus joint exam.  Investigation:  No additional findings.  Imaging: No results found.  Recent Labs: Lab Results  Component Value Date   WBC 6.5 05/17/2021   HGB 11.6 (L) 05/17/2021   PLT 243 05/17/2021   NA 141 05/17/2021   K 3.1 (L) 05/17/2021   CL 105 05/17/2021   CO2 25 05/17/2021   GLUCOSE 116 (H) 05/17/2021   BUN 13 05/17/2021   CREATININE 0.74 05/17/2021   BILITOT 0.3 01/22/2021   ALKPHOS 49 01/19/2017   AST 15 01/22/2021   ALT 19 01/22/2021   PROT 6.9 01/22/2021   ALBUMIN 4.2 01/19/2017   CALCIUM 9.1 05/17/2021   GFRAA 104 01/22/2021    Speciality Comments: PLQ Eye Exam: 08/07/2022 [facility] Follow up in 1 year    Procedures:  No procedures performed Allergies: Latex, Fluticasone, and Penicillins   Assessment / Plan:     Visit Diagnoses: Rheumatoid arthritis of multiple sites with negative rheumatoid factor-patient ran out of hydroxychloroquine about a month ago.  She has been having increased pain and stiffness.  No synovitis was noted on the examination today.  She was taking hydroxychloroquine 200 mg 1 tablet p.o. twice a day Monday to Friday.  She states her father has been very sick and she was commuting back and forth from [place] and could not take her medications.  I will send the prescription today.  We will also obtain labs  today.  High risk medication use - Plaquenil 200 mg 1 tablet by mouth twice daily Monday through Friday. PLQ Eye Exam: 08/07/2022 -her previous labs in August 2023 were obtained at Dr. Lucilla Lame office.  Plan: CBC with Differential/Platelet, COMPLETE METABOLIC PANEL WITH GFR today and every 5 months.  Information on immunization was placed in the AVS.  Primary osteoarthritis of both hands-she has severe osteoarthritis in her hands with PIP and DIP thickening.  No synovitis was noted on the examination.  She continues to have stiffness and discomfort.  Primary osteoarthritis of both feet-she has off-and-on discomfort in her feet.  Proper fitting shoes were advised.  No synovitis was noted.  Primary osteoarthritis of left hip-x-rays obtained at the last visit showed moderate to severe osteoarthritis of the hip joint.  She was referred to orthopedics but she could not go due to her father's illness.  She can she needs to have discomfort in her left hip joint.  I discussed the option of cortisone injection.  Patient is not ready for total hip replacement yet.  Patient stated that she will contact us when she is ready for cortisone injection.  Trochanteric bursitis of both hips-she has mild trochanter bursitis which causes discomfort.  IT band stretches were demonstrated in the office.  Vitamin D deficiency - DEXA scan on September 19, 2019 was within normal limits. -Her vitamin D was low at 51 on Jan 22, 2021.  I do not see any repeat vitamin D level checked.  Will check vitamin D level today.  Plan: VITAMIN D 25 Hydroxy (Vit-D Deficiency, Fractures)  Other fatigue-probably related to insomnia and stress.  Primary insomnia-good sleep  History of hypertension-blood pressure was normal at 129/80.  She is on enalapril.  Mixed hyperlipidemia-she is on Pravachol.  Increased risk of heart disease with rheumatoid arthritis was discussed.  The need for regular exercise and dietary modifications were emphasized.   A handout was placed in the AVS.  Orders: Orders Placed This Encounter  Procedures   CBC with Differential/Platelet   COMPLETE METABOLIC PANEL WITH GFR   VITAMIN  D 25 Hydroxy (Vit-D Deficiency, Fractures)   Meds ordered this encounter  Medications   hydroxychloroquine (PLAQUENIL) 200 MG tablet    Sig: TAKE 1 TABLET TWICE A DAY MONDAY THROUGH FRIDAY ONLY    Dispense:  120 tablet    Refill:  0    Follow-Up Instructions: Return in about 5 months (around 06/16/2023) for Osteoarthritis, Rheumatoid arthritis.   Pollyann Savoy, MD  Note - This record has been created using Animal nutritionist.  Chart creation errors have been sought, but may not always  have been located. Such creation errors do not reflect on  the standard of medical care.

## 2023-02-03 ENCOUNTER — Ambulatory Visit: Payer: Self-pay | Admitting: Rheumatology

## 2023-05-18 ENCOUNTER — Other Ambulatory Visit: Payer: Self-pay | Admitting: Rheumatology

## 2023-05-18 DIAGNOSIS — M0609 Rheumatoid arthritis without rheumatoid factor, multiple sites: Secondary | ICD-10-CM

## 2023-05-18 NOTE — Telephone Encounter (Signed)
Last Fill: 01/14/2023  Eye exam: 08/07/2022   Labs: 05/08/2022 Hgb 11.5, Hct 35.4, MCH 26.6,  Baso % 1.2  Next Visit: 06/09/2023  Last Visit: 01/14/2023  [id number] arthritis of multiple sites with negative rheumatoid factor  Current Dose per office note on 01/14/2023: Plaquenil 200 mg 1 tablet by mouth twice daily Monday through Friday   Patient states she updated labs recently at her PCP's office. I called PCP's office to obtain lab results.  CBC and CMP were updated on 02/18/2023 and they will fax results.   Okay to refill Plaquenil?

## 2023-05-28 NOTE — Progress Notes (Deleted)
Office Visit Note  Patient: Stacey Turner             Date of Birth: 1961-08-04           MRN: [id number]             PCP: Laurann Montana, MD Referring: Laurann Montana, MD Visit Date: 06/09/2023 Occupation: @GUAROCC @  Subjective:  No chief complaint on file.   History of Present Illness: Stacey Turner is a 62 y.o. female ***     Activities of Daily Living:  Patient reports morning stiffness for *** {minute/hour:19697}.   Patient {ACTIONS;DENIES/REPORTS:21021675::"Denies"} nocturnal pain.  Difficulty dressing/grooming: {ACTIONS;DENIES/REPORTS:21021675::"Denies"} Difficulty climbing stairs: {ACTIONS;DENIES/REPORTS:21021675::"Denies"} Difficulty getting out of chair: {ACTIONS;DENIES/REPORTS:21021675::"Denies"} Difficulty using hands for taps, buttons, cutlery, and/or writing: {ACTIONS;DENIES/REPORTS:21021675::"Denies"}  No Rheumatology ROS completed.   PMFS History:  Patient Active Problem List   Diagnosis Date Noted   Chest pain of uncertain etiology 10/10/2021   Primary insomnia 01/16/2017   Rheumatoid arthritis of multiple sites with negative rheumatoid factor (HCC) 01/05/2017   High risk medication use 01/05/2017   Trochanteric bursitis of both hips 01/05/2017   Palpitations 10/06/2013   Cardiac murmur 10/06/2013   Essential hypertension 10/06/2013   Hyperlipidemia 10/06/2013   S/P myomectomy 03/14/2012   Obesity 01/19/2012   Fibroids 01/19/2012   Pelvic pain 01/19/2012    Past Medical History:  Diagnosis Date   Anxiety    Cardiac murmur    Class 2 severe obesity due to excess calories with serious comorbidity in adult, unspecified BMI (HCC)    Elevated white blood cell count    Fibroid uterus 2001   Fibroids    Grief    H. 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   Hyperlipidemia    Hypertension     Irregular periods/menstrual cycles 1999   LVH (left ventricular hypertrophy) due to hypertensive disease    Monilial vaginitis 2003   Palpitations    Pelvic pain in female 11/25/2011   PONV (postoperative nausea and vomiting)    Rheumatoid arthritis (HCC)    Seasonal allergic rhinitis    Sinusitis 2009   Situational stress    Stress 2004   Vitamin D deficiency    Yeast vaginitis 2004    Family History  Problem Relation Age of Onset   Hyperlipidemia Mother    Hypertension Mother    Heart failure Mother    Diabetes Mother    Hyperlipidemia Father    Hypertension Father    Cancer Father        Colon   Hypertension Sister    Hyperlipidemia Sister    Heart Problems Sister    Hypertrophic cardiomyopathy Sister    Hypertension Brother    Breast cancer Neg Hx    Past Surgical History:  Procedure Laterality Date   BREAST BIOPSY Left 2016   BUNIONECTOMY     GANGLION CYST EXCISION     MYOMECTOMY  12/09/2011   Procedure: MYOMECTOMY;  Surgeon: Kirkland Hun, MD;  Location: [facility];  Service: Gynecology;  Laterality: N/A;  Abdominal Myomectomy, Biopsy abdominal Myometrium   Social History   Social History Narrative   Not on file   Immunization History  Administered Date(s) Administered   PFIZER(Purple Top)SARS-COV-2 Vaccination 05/16/2020, 06/06/2020     Objective: Vital Signs: LMP 12/27/2013    Physical Exam  Musculoskeletal Exam: ***  CDAI Exam: CDAI Score: -- Patient Global: --; Provider Global: -- Swollen: --; Tender: -- Joint Exam 06/09/2023   No joint exam has been documented for this visit   There is currently no information documented on the homunculus. Go to the Rheumatology activity and complete the homunculus joint exam.  Investigation: No additional findings.  Imaging: No results found.  Recent Labs: Lab Results  Component Value Date   WBC 6.5 05/17/2021   HGB 11.6 (L) 05/17/2021   PLT 243 05/17/2021   NA 141 05/17/2021   K 3.1 (L)  05/17/2021   CL 105 05/17/2021   CO2 25 05/17/2021   GLUCOSE 116 (H) 05/17/2021   BUN 13 05/17/2021   CREATININE 0.74 05/17/2021   BILITOT 0.3 01/22/2021   ALKPHOS 49 01/19/2017   AST 15 01/22/2021   ALT 19 01/22/2021   PROT 6.9 01/22/2021   ALBUMIN 4.2 01/19/2017   CALCIUM 9.1 05/17/2021   GFRAA 104 01/22/2021    Speciality Comments: PLQ Eye Exam: 08/07/2022 [facility] Follow up in 1 year    Procedures:  No procedures performed Allergies: Latex, Fluticasone, and Penicillins   Assessment / Plan:     Visit Diagnoses: No diagnosis found.  Orders: No orders of the defined types were placed in this encounter.  No orders of the defined types were placed in this encounter.   Face-to-face time spent with patient was *** minutes. Greater than 50% of time was spent in counseling and coordination of care.  Follow-Up Instructions: No follow-ups on file.   Ellen Henri, CMA  Note - This record has been created using Animal nutritionist.  Chart creation errors have been sought, but may not always  have been located. Such creation errors do not reflect on  the standard of medical care.

## 2023-06-09 ENCOUNTER — Ambulatory Visit: Payer: Self-pay | Admitting: Rheumatology

## 2023-06-09 DIAGNOSIS — Z8679 Personal history of other diseases of the circulatory system: Secondary | ICD-10-CM

## 2023-06-09 DIAGNOSIS — Z79899 Other long term (current) drug therapy: Secondary | ICD-10-CM

## 2023-06-09 DIAGNOSIS — M19071 Primary osteoarthritis, right ankle and foot: Secondary | ICD-10-CM

## 2023-06-09 DIAGNOSIS — M1612 Unilateral primary osteoarthritis, left hip: Secondary | ICD-10-CM

## 2023-06-09 DIAGNOSIS — F5101 Primary insomnia: Secondary | ICD-10-CM

## 2023-06-09 DIAGNOSIS — M7061 Trochanteric bursitis, right hip: Secondary | ICD-10-CM

## 2023-06-09 DIAGNOSIS — M0609 Rheumatoid arthritis without rheumatoid factor, multiple sites: Secondary | ICD-10-CM

## 2023-06-09 DIAGNOSIS — E782 Mixed hyperlipidemia: Secondary | ICD-10-CM

## 2023-06-09 DIAGNOSIS — R5383 Other fatigue: Secondary | ICD-10-CM

## 2023-06-09 DIAGNOSIS — E559 Vitamin D deficiency, unspecified: Secondary | ICD-10-CM

## 2023-06-09 DIAGNOSIS — M19042 Primary osteoarthritis, left hand: Secondary | ICD-10-CM

## 2023-06-25 ENCOUNTER — Ambulatory Visit: Payer: Self-pay | Admitting: Rheumatology

## 2023-09-03 ENCOUNTER — Other Ambulatory Visit: Payer: Self-pay | Admitting: Rheumatology

## 2023-09-03 DIAGNOSIS — M0609 Rheumatoid arthritis without rheumatoid factor, multiple sites: Secondary | ICD-10-CM

## 2023-09-06 NOTE — Telephone Encounter (Signed)
Last Fill: 05/18/2023  Eye exam: 08/07/2022 I called patient  to schedule eye exam   Labs: I called patient, patient had labs done at PCP 04/2023 per patient, patient will call PCP to have labs faxed to office.  Next Visit: 10/05/2023  Last Visit: 01/14/2023   NW:[id number] arthritis of multiple sites with negative rheumatoid factor   Current Dose per office note 01/14/2023: Plaquenil 200 mg 1 tablet by mouth twice daily Monday through Friday   Okay to refill Plaquenil?

## 2023-09-21 NOTE — Progress Notes (Deleted)
 Office Visit Note  Patient: Stacey Turner             Date of Birth: 1961-08-25           MRN: [id number]             PCP: Teresa Channel, MD Referring: Teresa Channel, MD Visit Date: 10/05/2023 Occupation: @GUAROCC @  Subjective:  No chief complaint on file.   History of Present Illness: Stacey Turner is a 62 y.o. female ***     Activities of Daily Living:  Patient reports morning stiffness for *** {minute/hour:19697}.   Patient {ACTIONS;DENIES/REPORTS:21021675::Denies} nocturnal pain.  Difficulty dressing/grooming: {ACTIONS;DENIES/REPORTS:21021675::Denies} Difficulty climbing stairs: {ACTIONS;DENIES/REPORTS:21021675::Denies} Difficulty getting out of chair: {ACTIONS;DENIES/REPORTS:21021675::Denies} Difficulty using hands for taps, buttons, cutlery, and/or writing: {ACTIONS;DENIES/REPORTS:21021675::Denies}  No Rheumatology ROS completed.   PMFS History:  Patient Active Problem List   Diagnosis Date Noted   Chest pain of uncertain etiology 10/10/2021   Primary insomnia 01/16/2017   Rheumatoid arthritis of multiple sites with negative rheumatoid factor (HCC) 01/05/2017   High risk medication use 01/05/2017   Trochanteric bursitis of both hips 01/05/2017   Palpitations 10/06/2013   Cardiac murmur 10/06/2013   Essential hypertension 10/06/2013   Hyperlipidemia 10/06/2013   S/P myomectomy 03/14/2012   Obesity 01/19/2012   Fibroids 01/19/2012   Pelvic pain 01/19/2012    Past Medical History:  Diagnosis Date   Anxiety    Cardiac murmur    Class 2 severe obesity due to excess calories with serious comorbidity in adult, unspecified BMI (HCC)    Elevated white blood cell count    Fibroid uterus 2001   Fibroids    Grief    H. 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   Hyperlipidemia    Hypertension     Irregular periods/menstrual cycles 1999   LVH (left ventricular hypertrophy) due to hypertensive disease    Monilial vaginitis 2003   Palpitations    Pelvic pain in female 11/25/2011   PONV (postoperative nausea and vomiting)    Rheumatoid arthritis (HCC)    Seasonal allergic rhinitis    Sinusitis 2009   Situational stress    Stress 2004   Vitamin D  deficiency    Yeast vaginitis 2004    Family History  Problem Relation Age of Onset   Hyperlipidemia Mother    Hypertension Mother    Heart failure Mother    Diabetes Mother    Hyperlipidemia Father    Hypertension Father    Cancer Father        Colon   Hypertension Sister    Hyperlipidemia Sister    Heart Problems Sister    Hypertrophic cardiomyopathy Sister    Hypertension Brother    Breast cancer Neg Hx    Past Surgical History:  Procedure Laterality Date   BREAST BIOPSY Left 2016   BUNIONECTOMY     GANGLION CYST EXCISION     MYOMECTOMY  12/09/2011   Procedure: MYOMECTOMY;  Surgeon: Rome Rigg, MD;  Location: [facility];  Service: Gynecology;  Laterality: N/A;  Abdominal Myomectomy, Biopsy abdominal Myometrium   Social History   Social History Narrative   Not on file   Immunization History  Administered Date(s) Administered   PFIZER(Purple Top)SARS-COV-2 Vaccination 05/16/2020, 06/06/2020     Objective: Vital Signs: LMP 12/27/2013    Physical Exam  Musculoskeletal Exam: ***  CDAI Exam: CDAI Score: -- Patient Global: --; Provider Global: -- Swollen: --; Tender: -- Joint Exam 10/05/2023   No joint exam has been documented for this visit   There is currently no information documented on the homunculus. Go to the Rheumatology activity and complete the homunculus joint exam.  Investigation: No additional findings.  Imaging: No results found.  Recent Labs: Lab Results  Component Value Date   WBC 6.5 05/17/2021   HGB 11.6 (L) 05/17/2021   PLT 243 05/17/2021   NA 141 05/17/2021   K 3.1 (L)  05/17/2021   CL 105 05/17/2021   CO2 25 05/17/2021   GLUCOSE 116 (H) 05/17/2021   BUN 13 05/17/2021   CREATININE 0.74 05/17/2021   BILITOT 0.3 01/22/2021   ALKPHOS 49 01/19/2017   AST 15 01/22/2021   ALT 19 01/22/2021   PROT 6.9 01/22/2021   ALBUMIN 4.2 01/19/2017   CALCIUM 9.1 05/17/2021   GFRAA 104 01/22/2021    Speciality Comments: PLQ Eye Exam: 08/07/2022 [facility] Follow up in 1 year Patient will call to schedule appt.    Procedures:  No procedures performed Allergies: Latex, Fluticasone, and Penicillins   Assessment / Plan:     Visit Diagnoses: No diagnosis found.  Orders: No orders of the defined types were placed in this encounter.  No orders of the defined types were placed in this encounter.   Face-to-face time spent with patient was *** minutes. Greater than 50% of time was spent in counseling and coordination of care.  Follow-Up Instructions: No follow-ups on file.   Maya Nash, MD  Note - This record has been created using Animal nutritionist.  Chart creation errors have been sought, but may not always  have been located. Such creation errors do not reflect on  the standard of medical care.

## 2023-10-05 ENCOUNTER — Ambulatory Visit: Payer: Self-pay | Admitting: Rheumatology

## 2023-10-05 DIAGNOSIS — Z8679 Personal history of other diseases of the circulatory system: Secondary | ICD-10-CM

## 2023-10-05 DIAGNOSIS — R5383 Other fatigue: Secondary | ICD-10-CM

## 2023-10-05 DIAGNOSIS — M19072 Primary osteoarthritis, left ankle and foot: Secondary | ICD-10-CM

## 2023-10-05 DIAGNOSIS — M7061 Trochanteric bursitis, right hip: Secondary | ICD-10-CM

## 2023-10-05 DIAGNOSIS — E559 Vitamin D deficiency, unspecified: Secondary | ICD-10-CM

## 2023-10-05 DIAGNOSIS — M1612 Unilateral primary osteoarthritis, left hip: Secondary | ICD-10-CM

## 2023-10-05 DIAGNOSIS — M19042 Primary osteoarthritis, left hand: Secondary | ICD-10-CM

## 2023-10-05 DIAGNOSIS — E782 Mixed hyperlipidemia: Secondary | ICD-10-CM

## 2023-10-05 DIAGNOSIS — F5101 Primary insomnia: Secondary | ICD-10-CM

## 2023-10-05 DIAGNOSIS — M0609 Rheumatoid arthritis without rheumatoid factor, multiple sites: Secondary | ICD-10-CM

## 2023-10-05 DIAGNOSIS — Z79899 Other long term (current) drug therapy: Secondary | ICD-10-CM

## 2023-10-06 ENCOUNTER — Other Ambulatory Visit: Payer: Self-pay | Admitting: Physician Assistant

## 2023-10-06 DIAGNOSIS — M0609 Rheumatoid arthritis without rheumatoid factor, multiple sites: Secondary | ICD-10-CM

## 2024-05-04 ENCOUNTER — Other Ambulatory Visit: Payer: Self-pay | Admitting: Physician Assistant

## 2024-05-04 DIAGNOSIS — Z1231 Encounter for screening mammogram for malignant neoplasm of breast: Secondary | ICD-10-CM

## 2024-05-11 NOTE — Progress Notes (Signed)
 Office Visit Note  Patient: Stacey Turner             Date of Birth: Aug 12, 1961           MRN: [id number]             PCP: Stacey Heron Ruth, PA-C Referring: Stacey Channel, MD Visit Date: 05/25/2024 Occupation: @GUAROCC @  Subjective:  Medication management  History of Present Illness: Stacey Turner is a 63 y.o. female with seronegative rheumatoid arthritis and osteoarthritis.  She returns today after her last visit in April 2024.  Patient states she could not come back for the follow-up visit due to her insurance issues.  However she had enough refills of Plaquenil  to last until April 2025.  She states she had a gap of medication until July when the prescription refill was filled by her PCP.  She has been taking Plaquenil  1 tablet twice a day Monday to Friday.  She had a flare of rheumatoid arthritis while she was off Plaquenil  and then the symptoms improved.  She has not had an eye examination since November 2023.  She plans to schedule an appointment with [facility].  She continues to have discomfort in her hands and her left hip which she describes over the trochanteric region and left hip.    Activities of Daily Living:  Patient reports morning stiffness for 0 minutes.   Patient Denies nocturnal pain.  Difficulty dressing/grooming: Denies Difficulty climbing stairs: Denies Difficulty getting out of chair: Denies Difficulty using hands for taps, buttons, cutlery, and/or writing: Denies  Review of Systems  Constitutional:  Positive for fatigue.  HENT:  Negative for mouth sores and mouth dryness.   Eyes:  Negative for dryness.  Respiratory:  Negative for shortness of breath.   Cardiovascular:  Positive for palpitations. Negative for chest pain.  Gastrointestinal:  Negative for blood in stool, constipation and diarrhea.  Endocrine: Negative for increased urination.  Genitourinary:  Negative for involuntary urination.  Musculoskeletal:  Positive for joint pain and joint pain.  Negative for gait problem, joint swelling, myalgias, muscle weakness, morning stiffness, muscle tenderness and myalgias.  Skin:  Positive for sensitivity to sunlight. Negative for color change, rash and hair loss.  Allergic/Immunologic: Negative for susceptible to infections.  Neurological:  Negative for dizziness and headaches.  Hematological:  Negative for swollen glands.  Psychiatric/Behavioral:  Positive for sleep disturbance. Negative for depressed mood. The patient is nervous/anxious.     PMFS History:  Patient Active Problem List   Diagnosis Date Noted   Chest pain of uncertain etiology 10/10/2021   Primary insomnia 01/16/2017   Rheumatoid arthritis of multiple sites with negative rheumatoid factor (HCC) 01/05/2017   High risk medication use 01/05/2017   Trochanteric bursitis of both hips 01/05/2017   Palpitations 10/06/2013   Cardiac murmur 10/06/2013   Essential hypertension 10/06/2013   Hyperlipidemia 10/06/2013   S/P myomectomy 03/14/2012   Obesity 01/19/2012   Fibroids 01/19/2012   Pelvic pain 01/19/2012    Past Medical History:  Diagnosis Date   Anxiety    Cardiac murmur    Class 2 severe obesity due to excess calories with serious comorbidity in adult, unspecified BMI (HCC)    Elevated white blood cell count    Fibroid uterus 2001   Fibroids    Grief    H. 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   Hyperlipidemia    Hypertension    Irregular periods/menstrual cycles 1999   LVH (left ventricular hypertrophy) due to hypertensive disease    Monilial vaginitis 2003   Palpitations    Pelvic pain in female 11/25/2011   PONV (postoperative nausea and vomiting)    Rheumatoid arthritis (HCC)    Seasonal allergic rhinitis    Sinusitis 2009   Situational stress    Stress 2004   Vitamin D  deficiency    Yeast  vaginitis 2004    Family History  Problem Relation Age of Onset   Hyperlipidemia Mother    Hypertension Mother    Heart failure Mother    Diabetes Mother    Hyperlipidemia Father    Hypertension Father    Cancer Father        Colon   Hypertension Sister    Hyperlipidemia Sister    Heart Problems Sister    Hypertrophic cardiomyopathy Sister    Hypertension Brother    Breast cancer Neg Hx    Past Surgical History:  Procedure Laterality Date   BREAST BIOPSY Left 2016   BUNIONECTOMY     GANGLION CYST EXCISION     MYOMECTOMY  12/09/2011   Procedure: MYOMECTOMY;  Surgeon: Rome Rigg, MD;  Location: [facility];  Service: Gynecology;  Laterality: N/A;  Abdominal Myomectomy, Biopsy abdominal Myometrium   Social History   Social History Narrative   Not on file   Immunization History  Administered Date(s) Administered   PFIZER(Purple Top)SARS-COV-2 Vaccination 05/16/2020, 06/06/2020     Objective: Vital Signs: BP 123/72 (BP Location: Left Arm, Patient Position: Sitting, Cuff Size: Normal)   Pulse 75   Resp 16   Ht 5' 2 (1.575 m)   Wt 185 lb (83.9 kg)   LMP 12/27/2013   BMI 33.84 kg/m    Physical Exam Vitals and nursing note reviewed.  Constitutional:      Appearance: She is well-developed.  HENT:     Head: Normocephalic and atraumatic.  Eyes:     Conjunctiva/sclera: Conjunctivae normal.  Cardiovascular:     Rate and Rhythm: Normal rate and regular rhythm.     Heart sounds: Normal heart sounds.  Pulmonary:     Effort: Pulmonary effort is normal.     Breath sounds: Normal breath sounds.  Abdominal:     General: Bowel sounds are normal.     Palpations: Abdomen is soft.  Musculoskeletal:     Cervical back: Normal range of motion.  Lymphadenopathy:     Cervical: No cervical adenopathy.  Skin:    General: Skin is warm and dry.     Capillary Refill: Capillary refill takes less than 2 seconds.  Neurological:     Mental Status: She is alert and oriented to person,  place, and time.  Psychiatric:        Behavior: Behavior normal.      Musculoskeletal Exam: She had good range of motion of the cervical spine.  No trapezius spasm was noted.  There was no tenderness over thoracic or lumbar spine.  Shoulders, elbows, wrist joints, MCPs, PIPs and DIPs with good range of motion.  No synovitis was noted.  She had some tenderness over PIP and DIP joints.  She had limited range of motion of bilateral hip joints and discomfort over bilateral trochanteric bursa more so on the left side.  Knee joints in good range of motion without any warmth swelling or effusion.  There was no  tenderness over ankles or MTPs.  CDAI Exam: CDAI Score: -- Patient Global: --; Provider Global: -- Swollen: --; Tender: -- Joint Exam 05/25/2024   No joint exam has been documented for this visit   There is currently no information documented on the homunculus. Go to the Rheumatology activity and complete the homunculus joint exam.  Investigation: No additional findings.  Imaging: MM 3D SCREENING MAMMOGRAM BILATERAL BREAST Result Date: 05/24/2024 CLINICAL DATA:  Screening. EXAM: DIGITAL SCREENING BILATERAL MAMMOGRAM WITH TOMOSYNTHESIS AND CAD TECHNIQUE: Bilateral screening digital craniocaudal and mediolateral oblique mammograms were obtained. Bilateral screening digital breast tomosynthesis was performed. The images were evaluated with computer-aided detection. COMPARISON:  Previous exam(s). ACR Breast Density Category b: There are scattered areas of fibroglandular density. FINDINGS: There are no findings suspicious for malignancy. IMPRESSION: No mammographic evidence of malignancy. A result letter of this screening mammogram will be mailed directly to the patient. RECOMMENDATION: Screening mammogram in one year. (Code:SM-B-01Y) BI-RADS CATEGORY  1: Negative. Electronically Signed   By: Alm Parkins M.D.   On: 05/24/2024 12:52    Recent Labs: Lab Results  Component Value Date   WBC 6.5  05/17/2021   HGB 11.6 (L) 05/17/2021   PLT 243 05/17/2021   NA 141 05/17/2021   K 3.1 (L) 05/17/2021   CL 105 05/17/2021   CO2 25 05/17/2021   GLUCOSE 116 (H) 05/17/2021   BUN 13 05/17/2021   CREATININE 0.74 05/17/2021   BILITOT 0.3 01/22/2021   ALKPHOS 49 01/19/2017   AST 15 01/22/2021   ALT 19 01/22/2021   PROT 6.9 01/22/2021   ALBUMIN 4.2 01/19/2017   CALCIUM 9.1 05/17/2021   GFRAA 104 01/22/2021   March 31, 2024 CBC WBC 4.9, hemoglobin 12.1, platelets 250, magnesium 2.17, vitamin D  26.98, hepatitis B surface antigen negative, TSH normal, ferritin normal, hepatitis C nonreactive, CMP creatinine 0.7, AST 19, ALT 18, lipid panel LDL 91, triglycerides 59, iron studies normal, HIV nonreactive, UA negative  Speciality Comments: PLQ Eye Exam: 08/07/2022 [facility] Follow up in 1 year Patient will call to schedule appt.    Procedures:  Large Joint Inj: L greater trochanter on 05/25/2024 1:51 PM Indications: pain Details: 27 G 1.5 in needle, lateral approach  Arthrogram: No  Medications: 40 mg triamcinolone  acetonide 40 MG/ML; 1.5 mL lidocaine  1 % Aspirate: 0 mL Outcome: tolerated well, no immediate complications  Infection, tendon injury, nerve injury, hypopigmentation and dermal atrophy were discussed. Procedure, treatment alternatives, risks and benefits explained, specific risks discussed. Consent was given by the patient. Immediately prior to procedure a time out was called to verify the correct patient, procedure, equipment, support staff and site/side marked as required. Patient was prepped and draped in the usual sterile fashion.     Allergies: Latex, Fluticasone, and Penicillins   Assessment / Plan:     Visit Diagnoses: Rheumatoid arthritis of multiple sites with negative rheumatoid factor (HCC)-patient returns today after her last visit in April 2024.  She states she ran out of hydroxychloroquine  in April and had a flare.  She was restarted on Plaquenil  by  her PCP in July.  She has not had eye examination since November 2023.  Need for having annual eye examination to monitor for ocular toxicity was emphasized.  Advised her to get an eye examination as soon as possible so we can continue to refill her Plaquenil  prescription.  She had no synovitis on the examination.  She continues to have some discomfort in her hands which she describes over  the PIP joints.  She also continues to have discomfort in her hips.  High risk medication use - Plaquenil  200 mg 1 tablet by mouth twice daily Monday through Friday. PLQ Eye Exam: 08/07/2022 -March 31, 2024 CBC and CMP were normal.  Information reimmunization was placed in the AVS.  Patient plans to schedule an appointment with ophthalmology soon.  Primary osteoarthritis of both hands-she has been bilateral PIP and DIP thickening suggestive of osteoarthritis.  Joint protection muscle strengthening was discussed.  A handout on hand exercises was given.  Primary osteoarthritis of both feet-she denies any discomfort today.  Primary osteoarthritis of left hip - x-rays obtained showed moderate to severe osteoarthritis of the hip joint. She was referred to orthopedics but she could not go due to her father's illness.  She had  limited range of motion of bilateral hip joints without much discomfort.  She would like a referral to an orthopedic surgeon again.  I will place the referral per patient's request.  Trochanteric bursitis of both hips-she continues to have tenderness over bilateral trochanteric bursa more so on the left side.  After informed consent was obtained per patient's request left trochanteric bursa was injected with lidocaine  and Kenalog  as described above.  Patient tolerated the procedure well.  Postprocedure instructions were given.  A handout on IT band stretches was given.  Vitamin D  deficiency - DEXA scan on September 19, 2019 was within normal limits.  Patient is states that she had recent DEXA scan.  I do  not have results available.  Advised her to bring results at the follow-up visit.  Primary insomnia-hygiene was discussed.  Other fatigue-related to insomnia.  History of hypertension-blood was normal today at 123/72.  Mixed hyperlipidemia-she is on statins.  History of colon polyps  Orders: Orders Placed This Encounter  Procedures   Large Joint Inj: L greater trochanter   Ambulatory referral to Orthopedic Surgery   No orders of the defined types were placed in this encounter.   Follow-Up Instructions: Return in about 5 months (around 10/25/2024) for Rheumatoid arthritis.   Maya Nash, MD  Note - This record has been created using Animal nutritionist.  Chart creation errors have been sought, but may not always  have been located. Such creation errors do not reflect on  the standard of medical care.

## 2024-05-18 ENCOUNTER — Ambulatory Visit
Admission: RE | Admit: 2024-05-18 | Discharge: 2024-05-18 | Disposition: A | Discharge status: Home / Self Care | Source: Ambulatory Visit | Attending: Physician Assistant | Admitting: Physician Assistant

## 2024-05-18 DIAGNOSIS — Z1231 Encounter for screening mammogram for malignant neoplasm of breast: Secondary | ICD-10-CM

## 2024-05-25 ENCOUNTER — Ambulatory Visit: Payer: Self-pay | Attending: Rheumatology | Admitting: Rheumatology

## 2024-05-25 ENCOUNTER — Encounter: Payer: Self-pay | Admitting: Rheumatology

## 2024-05-25 VITALS — BP 123/72 | HR 75 | Resp 16 | Ht 62.0 in | Wt 185.0 lb

## 2024-05-25 DIAGNOSIS — M7061 Trochanteric bursitis, right hip: Secondary | ICD-10-CM | POA: Insufficient documentation

## 2024-05-25 DIAGNOSIS — M0609 Rheumatoid arthritis without rheumatoid factor, multiple sites: Secondary | ICD-10-CM | POA: Insufficient documentation

## 2024-05-25 DIAGNOSIS — E782 Mixed hyperlipidemia: Secondary | ICD-10-CM | POA: Diagnosis present

## 2024-05-25 DIAGNOSIS — R5383 Other fatigue: Secondary | ICD-10-CM | POA: Insufficient documentation

## 2024-05-25 DIAGNOSIS — Z860100 Personal history of colon polyps, unspecified: Secondary | ICD-10-CM | POA: Diagnosis present

## 2024-05-25 DIAGNOSIS — E559 Vitamin D deficiency, unspecified: Secondary | ICD-10-CM | POA: Diagnosis present

## 2024-05-25 DIAGNOSIS — Z79899 Other long term (current) drug therapy: Secondary | ICD-10-CM | POA: Diagnosis not present

## 2024-05-25 DIAGNOSIS — M19041 Primary osteoarthritis, right hand: Secondary | ICD-10-CM | POA: Insufficient documentation

## 2024-05-25 DIAGNOSIS — M19042 Primary osteoarthritis, left hand: Secondary | ICD-10-CM | POA: Diagnosis present

## 2024-05-25 DIAGNOSIS — M7062 Trochanteric bursitis, left hip: Secondary | ICD-10-CM | POA: Diagnosis present

## 2024-05-25 DIAGNOSIS — Z8679 Personal history of other diseases of the circulatory system: Secondary | ICD-10-CM | POA: Insufficient documentation

## 2024-05-25 DIAGNOSIS — M19071 Primary osteoarthritis, right ankle and foot: Secondary | ICD-10-CM | POA: Insufficient documentation

## 2024-05-25 DIAGNOSIS — M1612 Unilateral primary osteoarthritis, left hip: Secondary | ICD-10-CM | POA: Insufficient documentation

## 2024-05-25 DIAGNOSIS — M19072 Primary osteoarthritis, left ankle and foot: Secondary | ICD-10-CM | POA: Diagnosis present

## 2024-05-25 DIAGNOSIS — F5101 Primary insomnia: Secondary | ICD-10-CM | POA: Diagnosis present

## 2024-05-25 MED ORDER — TRIAMCINOLONE ACETONIDE 40 MG/ML IJ SUSP
40.0000 mg | INTRAMUSCULAR | Status: AC | PRN
  Administered 2024-05-25: 40 mg via INTRA_ARTICULAR

## 2024-05-25 MED ORDER — LIDOCAINE HCL 1 % IJ SOLN
1.5000 mL | INTRAMUSCULAR | Status: AC | PRN
Start: 2024-05-25 — End: 2024-05-25
  Administered 2024-05-25: 1.5 mL

## 2024-05-25 NOTE — Patient Instructions (Signed)
 Please schedule eye examination as soon as possible to monitor for eye toxicity from Plaquenil  use.  Standing Labs We placed an order today for your standing lab work.   Please have your standing labs drawn in November  Please have your labs drawn 2 weeks prior to your appointment so that the provider can discuss your lab results at your appointment, if possible.  Please note that you may see your imaging and lab results in MyChart before we have reviewed them. We will contact you once all results are reviewed. Please allow our office up to 72 hours to thoroughly review all of the results before contacting the office for clarification of your results.  WALK-IN LAB HOURS  Monday through Thursday from 8:00 am -12:30 pm and 1:00 pm-4:30 pm and Friday from 8:00 am-12:00 pm.  Patients with office visits requiring labs will be seen before walk-in labs.  You may encounter longer than normal wait times. Please allow additional time. Wait times may be shorter on  Monday and Thursday afternoons.  We do not book appointments for walk-in labs. We appreciate your patience and understanding with our staff.   Labs are drawn by [facility]. Please bring your co-pay at the time of your lab draw.  You may receive a bill from [facility] for your lab work.  Please note if you are on Hydroxychloroquine  and and an order has been placed for a Hydroxychloroquine  level,  you will need to have it drawn 4 hours or more after your last dose.  If you wish to have your labs drawn at another location, please call the office 24 hours in advance so we can fax the orders.  The office is located at [address], [address], [place], [place] [address]   If you have any questions regarding directions or hours of operation,  please call [phone].   As a reminder, please drink plenty of water prior to coming for your lab work. Thanks!   Vaccines You are taking a medication(s) that can suppress your immune system.  The  following immunizations are recommended: Flu annually Covid-19  Td/Tdap (tetanus, diphtheria, pertussis) every 10 years Pneumonia (Prevnar 15 then Pneumovax 23 at least 1 year apart.  Alternatively, can take Prevnar 20 without needing additional dose) Shingrix: 2 doses from 4 weeks to 6 months apart  Please check with your PCP to make sure you are up to date.   Iliotibial Band Syndrome Rehab Ask your health care provider which exercises are safe for you. Do exercises exactly as told by your provider and adjust them as told. It's normal to feel mild stretching, pulling, tightness, or discomfort as you do these exercises. Stop right away if you feel sudden pain or your pain gets a lot worse. Do not begin these exercises until told by your provider. Stretching and range-of-motion exercises These exercises warm up your muscles and joints. They also improve the movement and flexibility of your hip and pelvis. Quadriceps stretch, prone  Lie face down (prone) on a firm surface like a bed or padded floor. Bend your left / right knee. Reach back to hold your ankle or pant leg. If you can't reach your ankle or pant leg, use a belt looped around your foot and grab the belt instead. Gently pull your heel toward your butt. Your knee should not slide out to the side. You should feel a stretch in the front of your thigh and knee, also called the quadriceps. Hold this position for __________ seconds. Repeat  __________ times. Complete this exercise __________ times a day. Iliotibial band stretch The iliotibial band is a strip of tissue that runs along the outside of your hip down to your knee. Lie on your side with your left / right leg on top. Bend both knees and grab your left / right ankle. Stretch out your bottom arm to help you balance. Slowly bring your top knee back so your thigh goes behind your back. Slowly lower your top leg toward the floor until you feel a gentle stretch on the outside of your  left / right hip and thigh. If you don't feel a stretch and your knee won't go farther, place the heel of your other foot on top of your knee and pull your knee down toward the floor with your foot. Hold this position for __________ seconds. Repeat __________ times. Complete this exercise __________ times a day. Strengthening exercises These exercises build strength and endurance in your hip and pelvis. Endurance means your muscles can keep working even when they're tired. Straight leg raises, side-lying This exercise strengthens the muscles that rotate the leg at the hip and move it away from your body. These muscles are called hip abductors. Lie on your side with your left / right leg on top. Lie so your head, shoulder, hip, and knee line up. You can bend your bottom knee to help you balance. Roll your hips slightly forward so they're stacked directly over each other. Your left / right knee should face forward. Tense the muscles in your outer thigh and hip. Lift your top leg 4-6 inches (10-15 cm) off the ground. Hold this position for __________ seconds. Slowly lower your leg back down to the starting position. Let your muscles fully relax before doing this exercise again. Repeat __________ times. Complete this exercise __________ times a day. Leg raises, prone This exercise strengthens the muscles that move the hips backward. These muscles are called hip extensors. Lie face down (prone) on your bed or a firm surface. You can put a pillow under your hips for comfort and to support your lower back. Bend your left / right knee so your foot points straight up toward the ceiling. Keep the other leg straight and behind you. Squeeze your butt muscles. Lift your left / right thigh off the firm surface. Do not let your back arch. Tense your thigh muscle as hard as you can without having more knee pain. Hold this position for __________ seconds. Slowly lower your leg to the starting position. Allow  your leg to relax all the way. Repeat __________ times. Complete this exercise __________ times a day. Hip hike  Stand sideways on a bottom step. Place your feet so that your left / right leg is on the step, and the other foot is hanging off the side. If you need support for balance, hold onto a railing or wall. Keep your knees straight and your abdomen square, meaning your hips are level. Then, lift your left / right hip up toward the ceiling. Slowly let your leg that's hanging off the step lower towards the floor. Your foot should get closer to the ground. Do not lean or bend your knees during this movement. Repeat __________ times. Complete this exercise __________ times a day. This information is not intended to replace advice given to you by your health care provider. Make sure you discuss any questions you have with your health care provider. Document Revised: 11/20/2022 Document Reviewed: 11/20/2022 Elsevier Patient Education  2024 [employer]  Hand Exercises Hand exercises can be helpful for almost anyone. They can strengthen your hands and improve flexibility and movement. The exercises can also increase blood flow to the hands. These results can make your work and daily tasks easier for you. Hand exercises can be especially helpful for people who have joint pain from arthritis or nerve damage from using their hands over and over. These exercises can also help people who injure a hand. Exercises Most of these hand exercises are gentle stretching and motion exercises. It is usually safe to do them often throughout the day. Warming up your hands before exercise may help reduce stiffness. You can do this with gentle massage or by placing your hands in warm water for 10-15 minutes. It is normal to feel some stretching, pulling, tightness, or mild discomfort when you begin new exercises. In time, this will improve. Remember to always be careful and stop right away if you feel sudden, very  bad pain or your pain gets worse. You want to get better and be safe. Ask your health care provider which exercises are safe for you. Do exercises exactly as told by your provider and adjust them as told. Do not begin these exercises until told by your provider. Knuckle bend or claw fist  Stand or sit with your arm, hand, and all five fingers pointed straight up. Make sure to keep your wrist straight. Gently bend your fingers down toward your palm until the tips of your fingers are touching your palm. Keep your big knuckle straight and only bend the small knuckles in your fingers. Hold this position for 10 seconds. Straighten your fingers back to your starting position. Repeat this exercise 5-10 times with each hand. Full finger fist  Stand or sit with your arm, hand, and all five fingers pointed straight up. Make sure to keep your wrist straight. Gently bend your fingers into your palm until the tips of your fingers are touching the middle of your palm. Hold this position for 10 seconds. Extend your fingers back to your starting position, stretching every joint fully. Repeat this exercise 5-10 times with each hand. Straight fist  Stand or sit with your arm, hand, and all five fingers pointed straight up. Make sure to keep your wrist straight. Gently bend your fingers at the big knuckle, where your fingers meet your hand, and at the middle knuckle. Keep the knuckle at the tips of your fingers straight and try to touch the bottom of your palm. Hold this position for 10 seconds. Extend your fingers back to your starting position, stretching every joint fully. Repeat this exercise 5-10 times with each hand. Tabletop  Stand or sit with your arm, hand, and all five fingers pointed straight up. Make sure to keep your wrist straight. Gently bend your fingers at the big knuckle, where your fingers meet your hand, as far down as you can. Keep the small knuckles in your fingers straight. Think of  forming a tabletop with your fingers. Hold this position for 10 seconds. Extend your fingers back to your starting position, stretching every joint fully. Repeat this exercise 5-10 times with each hand. Finger spread  Place your hand flat on a table with your palm facing down. Make sure your wrist stays straight. Spread your fingers and thumb apart from each other as far as you can until you feel a gentle stretch. Hold this position for 10 seconds. Bring your fingers and thumb tight together again. Hold this position for  10 seconds. Repeat this exercise 5-10 times with each hand. Making circles  Stand or sit with your arm, hand, and all five fingers pointed straight up. Make sure to keep your wrist straight. Make a circle by touching the tip of your thumb to the tip of your index finger. Hold for 10 seconds. Then open your hand wide. Repeat this motion with your thumb and each of your fingers. Repeat this exercise 5-10 times with each hand. Thumb motion  Sit with your forearm resting on a table and your wrist straight. Your thumb should be facing up toward the ceiling. Keep your fingers relaxed as you move your thumb. Lift your thumb up as high as you can toward the ceiling. Hold for 10 seconds. Bend your thumb across your palm as far as you can, reaching the tip of your thumb for the small finger (pinkie) side of your palm. Hold for 10 seconds. Repeat this exercise 5-10 times with each hand. Grip strengthening  Hold a stress ball or other soft ball in the middle of your hand. Slowly increase the pressure, squeezing the ball as much as you can without causing pain. Think of bringing the tips of your fingers into the middle of your palm. All of your finger joints should bend when doing this exercise. Hold your squeeze for 10 seconds, then relax. Repeat this exercise 5-10 times with each hand. Contact a health care provider if: Your hand pain or discomfort gets much worse when you do an  exercise. Your hand pain or discomfort does not improve within 2 hours after you exercise. If you have either of these problems, stop doing these exercises right away. Do not do them again unless your provider says that you can. Get help right away if: You develop sudden, severe hand pain or swelling. If this happens, stop doing these exercises right away. Do not do them again unless your provider says that you can. This information is not intended to replace advice given to you by your health care provider. Make sure you discuss any questions you have with your health care provider. Document Revised: 09/22/2022 Document Reviewed: 09/22/2022 Elsevier Patient Education  2024 [employer].

## 2024-06-23 LAB — OPHTHALMOLOGY REPORT-SCANNED

## 2024-08-28 ENCOUNTER — Telehealth: Payer: Self-pay | Admitting: Rheumatology

## 2024-08-28 NOTE — Telephone Encounter (Signed)
 Handicap placard form completed and is at the front desk for pick up. Advised patient and she verbalized understanding.

## 2024-08-28 NOTE — Telephone Encounter (Signed)
 Ok to fill the paper work.

## 2024-08-28 NOTE — Telephone Encounter (Signed)
 Patient called requesting a return call to let her know if she is a candidate to receive a handicap placard and if Dr. Dolphus would fill out the paperwork.

## 2024-08-29 ENCOUNTER — Telehealth: Payer: Self-pay

## 2024-08-29 ENCOUNTER — Other Ambulatory Visit: Payer: Self-pay

## 2024-08-29 ENCOUNTER — Ambulatory Visit: Payer: Self-pay

## 2024-08-29 ENCOUNTER — Ambulatory Visit: Admitting: Sports Medicine

## 2024-08-29 ENCOUNTER — Encounter: Payer: Self-pay | Admitting: Sports Medicine

## 2024-08-29 ENCOUNTER — Ambulatory Visit: Admitting: Orthopaedic Surgery

## 2024-08-29 DIAGNOSIS — M1612 Unilateral primary osteoarthritis, left hip: Secondary | ICD-10-CM

## 2024-08-29 MED ORDER — METHYLPREDNISOLONE ACETATE 40 MG/ML IJ SUSP
80.0000 mg | INTRAMUSCULAR | Status: AC | PRN
  Administered 2024-08-29: 80 mg via INTRA_ARTICULAR

## 2024-08-29 MED ORDER — LIDOCAINE HCL 1 % IJ SOLN
4.0000 mL | INTRAMUSCULAR | Status: AC | PRN
  Administered 2024-08-29: 4 mL

## 2024-08-29 NOTE — Progress Notes (Signed)
 Office Visit Note   Patient: Stacey Turner           Date of Birth: Jun 26, 1961           MRN: [id number] Visit Date: 08/29/2024              Requested by: Dolphus Reiter, MD [address] [place],  [place] [address] PCP: Jerome Heron Ruth, PA-C   Assessment & Plan: Visit Diagnoses:  1. Primary osteoarthritis of left hip     Plan: History of Present Illness Stacey Turner is a 63 year old female with rheumatoid arthritis and osteoarthritis who presents with worsening left hip pain. She was referred by Dr. Dolphus for evaluation of her left hip pain.  She has had progressive left hip pain for 2 years, now 8/10, described as a deep aching pain. It is worsened by activity, especially pushing her father's wheelchair, and by weightbearing. Pain has led to near-falls when she loads the left hip, so she uses support to prevent falls.  She uses Tylenol  and topical Biofreeze with inadequate relief.  She has rheumatoid arthritis and osteoarthritis.  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  Physical Exam MUSCULOSKELETAL: Left hip pain on flexion and rotation.  No trochanteric tenderness.  Results RADIOLOGY Left hip X-ray: Bone-on-bone contact, large degenerative subchondral cyst, end-stage arthritis  Assessment and Plan Primary osteoarthritis of left hip Chronic severe left hip pain due to end-stage osteoarthritis with bone-on-bone contact and large degenerative subchondral cyst. Current social situation precludes immediate hip replacement surgery. - Referred to Dr. Burnetta for cortisone injection. - Provided information on hip replacement surgery for future planning. - currently is caregiver for father so not in a position to do THA at this time  Follow-Up Instructions: No follow-ups on file.   Orders:  Orders Placed This  Encounter  Procedures   XR HIP UNILAT W OR W/O PELVIS 2-3 VIEWS LEFT   No orders of the defined types were placed in this encounter.     Procedures: No procedures performed   Clinical Data: No additional findings.   Subjective: Chief Complaint  Patient presents with   Left Hip - Pain    HPI  Review of Systems  Constitutional: Negative.   HENT: Negative.    Eyes: Negative.   Respiratory: Negative.    Cardiovascular: Negative.   Endocrine: Negative.   Musculoskeletal: Negative.   Neurological: Negative.   Hematological: Negative.   Psychiatric/Behavioral: Negative.    All other systems reviewed and are negative.    Objective: Vital Signs: LMP 12/27/2013   Physical Exam Vitals and nursing note reviewed.  Constitutional:      Appearance: She is well-developed.  HENT:     Head: Atraumatic.     Nose: Nose normal.  Eyes:     Extraocular Movements: Extraocular movements intact.  Cardiovascular:     Pulses: Normal pulses.  Pulmonary:     Effort: Pulmonary effort is normal.  Abdominal:     Palpations: Abdomen is soft.  Musculoskeletal:     Cervical back: Neck supple.  Skin:    General: Skin is warm.     Capillary Refill: Capillary refill takes less than 2 seconds.  Neurological:     Mental Status: She is alert. Mental status is at baseline.  Psychiatric:        Behavior: Behavior normal.  Thought Content: Thought content normal.        Judgment: Judgment normal.     Ortho Exam  Specialty Comments:  No specialty comments available.  Imaging: XR HIP UNILAT W OR W/O PELVIS 2-3 VIEWS LEFT Result Date: 08/29/2024 Xrays of the left hip show advanced osteoarthritis with bone-on-bone joint space narrowing.  Large subchondral cyst in acetabulum.  Kellgren-Lawrence stage IV    PMFS History: Patient Active Problem List   Diagnosis Date Noted   Primary osteoarthritis of left hip 08/29/2024   Chest pain of uncertain etiology 10/10/2021   Primary  insomnia 01/16/2017   Rheumatoid arthritis of multiple sites with negative rheumatoid factor (HCC) 01/05/2017   High risk medication use 01/05/2017   Trochanteric bursitis of both hips 01/05/2017   Palpitations 10/06/2013   Cardiac murmur 10/06/2013   Essential hypertension 10/06/2013   Hyperlipidemia 10/06/2013   S/P myomectomy 03/14/2012   Obesity 01/19/2012   Fibroids 01/19/2012   Pelvic pain 01/19/2012   Past Medical History:  Diagnosis Date   Anxiety    Cardiac murmur    Class 2 severe obesity due to excess calories with serious comorbidity in adult, unspecified BMI    Elevated white blood cell count    Fibroid uterus 2001   Fibroids    Grief    H. 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   Hyperlipidemia    Hypertension    Irregular periods/menstrual cycles 1999   LVH (left ventricular hypertrophy) due to hypertensive disease    Monilial vaginitis 2003   Palpitations    Pelvic pain in female 11/25/2011   PONV (postoperative nausea and vomiting)    Rheumatoid arthritis (HCC)    Seasonal allergic rhinitis    Sinusitis 2009   Situational stress    Stress 2004   Vitamin D  deficiency    Yeast vaginitis 2004    Family History  Problem Relation Age of Onset   Hyperlipidemia Mother    Hypertension Mother    Heart failure Mother    Diabetes Mother    Hyperlipidemia Father    Hypertension Father    Cancer Father        Colon   Hypertension Sister    Hyperlipidemia Sister    Heart Problems Sister    Hypertrophic cardiomyopathy Sister    Hypertension Brother    Breast cancer Neg Hx     Past Surgical History:  Procedure Laterality Date   BREAST BIOPSY Left 2016   BUNIONECTOMY     GANGLION CYST EXCISION     MYOMECTOMY  12/09/2011   Procedure: MYOMECTOMY;  Surgeon: Rome Rigg, MD;  Location: [facility];   Service: Gynecology;  Laterality: N/A;  Abdominal Myomectomy, Biopsy abdominal Myometrium   Social History   Occupational History   Not on file  Tobacco Use   Smoking status: Never    Passive exposure: Never   Smokeless tobacco: Never  Vaping Use   Vaping status: Never Used  Substance and Sexual Activity   Alcohol use: No   Drug use: No   Sexual activity: Yes

## 2024-08-29 NOTE — Telephone Encounter (Signed)
 Dr Jerri wanting patient to be worked in for left hip injection. Are you able to reach out to her about possibly being worked in.

## 2024-08-29 NOTE — Telephone Encounter (Signed)
Patient scheduled at 3pm today.

## 2024-08-29 NOTE — Progress Notes (Signed)
   Procedure Note  Patient: Stacey Turner             Date of Birth: 05-11-1961           MRN: [id number]             Visit Date: 08/29/2024  Procedures: Visit Diagnoses:  1. Primary osteoarthritis of left hip    Large Joint Inj: L hip joint on 08/29/2024 3:02 PM Indications: pain Details: 22 G 3.5 in needle, ultrasound-guided anterior approach Medications: 4 mL lidocaine  1 %; 80 mg methylPREDNISolone  acetate 40 MG/ML Outcome: tolerated well, no immediate complications  Procedure: US -guided intra-articular hip injection, Left After discussion on risks/benefits/indications and informed verbal consent was obtained, a timeout was performed. Patient was lying supine on exam table. The hip was cleaned with betadine and alcohol swabs. Then utilizing ultrasound guidance, the patient's femoral head and neck junction was identified and subsequently injected with 4:2 lidocaine :depomedrol via an in-plane approach with ultrasound visualization of the injectate administered into the hip joint. Patient tolerated procedure well without immediate complications.  Procedure, treatment alternatives, risks and benefits explained, specific risks discussed. Consent was given by the patient. Immediately prior to procedure a time out was called to verify the correct patient, procedure, equipment, support staff and site/side marked as required. Patient was prepped and draped in the usual sterile fashion.     - patient tolerated procedure well, discussed post-injection protocol - follow-up with Dr. Jerri as indicated; I am happy to see them as needed  Lonell Sprang, DO Primary Care Sports Medicine Physician  [facility]  This note was dictated using Dragon naturally speaking software and may contain errors in syntax, spelling, or content which have not been identified prior to signing this note.

## 2024-08-29 NOTE — Telephone Encounter (Signed)
 Tried to call patient to schedule for hip injection with Dr.Brooks. No answer. LMOM that Dr.Brooks has an opening at 3pm. She can call back if interested and hopefully that spot will still be available, but possibly not.

## 2024-10-12 NOTE — Progress Notes (Signed)
 "  Office Visit Note  Patient: Stacey Turner             Date of Birth: 11/07/1960           MRN: [id number]             PCP: Jerome Heron Ruth, PA-C Referring: Jerome Heron Ruth, PA-C Visit Date: 10/26/2024 Occupation: Data Unavailable  Subjective:  Medication management  History of Present Illness: Stacey Turner is a 64 y.o. female with seronegative rheumatoid arthritis and osteoarthritis.  She returns today after her last visit in September 2025.  She states she has been doing well since the last visit.  She had left trochanteric bursa injection on May 25, 2024 and then left hip joint injection on August 29, 2024.  She had good relief from the injection.  She has been taking hydroxychloroquine  200 mg twice daily Monday to Friday without any interruption.    Activities of Daily Living:  Patient reports morning stiffness for 0 minutes.   Patient Denies nocturnal pain.  Difficulty dressing/grooming: Denies Difficulty climbing stairs: Reports Difficulty getting out of chair: Reports Difficulty using hands for taps, buttons, cutlery, and/or writing: Denies  Review of Systems  Constitutional:  Negative for fatigue.  HENT:  Negative for mouth sores and mouth dryness.   Eyes:  Negative for dryness.  Respiratory:  Negative for shortness of breath.   Cardiovascular:  Negative for chest pain and palpitations.  Gastrointestinal:  Negative for blood in stool, constipation and diarrhea.  Endocrine: Negative for increased urination.  Genitourinary:  Negative for involuntary urination.  Musculoskeletal:  Positive for myalgias, muscle tenderness and myalgias. Negative for joint pain, gait problem, joint pain, joint swelling, muscle weakness and morning stiffness.  Skin:  Negative for color change, rash, hair loss and sensitivity to sunlight.  Allergic/Immunologic: Negative for susceptible to infections.  Neurological:  Negative for dizziness and headaches.  Hematological:  Negative  for swollen glands.  Psychiatric/Behavioral:  Positive for sleep disturbance. Negative for depressed mood. The patient is not nervous/anxious.     PMFS History:  Patient Active Problem List   Diagnosis Date Noted   Primary osteoarthritis of left hip 08/29/2024   Chest pain of uncertain etiology 10/10/2021   Primary insomnia 01/16/2017   Rheumatoid arthritis of multiple sites with negative rheumatoid factor (HCC) 01/05/2017   High risk medication use 01/05/2017   Trochanteric bursitis of both hips 01/05/2017   Palpitations 10/06/2013   Cardiac murmur 10/06/2013   Essential hypertension 10/06/2013   Hyperlipidemia 10/06/2013   S/P myomectomy 03/14/2012   Obesity 01/19/2012   Fibroids 01/19/2012   Pelvic pain 01/19/2012    Past Medical History:  Diagnosis Date   Anxiety    Cardiac murmur    Class 2 severe obesity due to excess calories with serious comorbidity in adult, unspecified BMI    Elevated white blood cell count    Fibroid uterus 2001   Fibroids    Grief    H. 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   Hyperlipidemia    Hypertension    Irregular periods/menstrual cycles 1999   LVH (left ventricular hypertrophy) due to hypertensive disease    Monilial vaginitis 2003   Palpitations    Pelvic pain in female 11/25/2011   PONV (postoperative nausea and vomiting)  Rheumatoid arthritis (HCC)    Seasonal allergic rhinitis    Sinusitis 2009   Situational stress    Stress 2004   Vitamin D  deficiency    Yeast vaginitis 2004    Family History  Problem Relation Age of Onset   Hyperlipidemia Mother    Hypertension Mother    Heart failure Mother    Diabetes Mother    Hyperlipidemia Father    Hypertension Father    Cancer Father        Colon   Hypertension Sister    Hyperlipidemia Sister    Heart Problems  Sister    Hypertrophic cardiomyopathy Sister    Hypertension Brother    Breast cancer Neg Hx    Past Surgical History:  Procedure Laterality Date   BREAST BIOPSY Left 2016   BUNIONECTOMY     GANGLION CYST EXCISION     MYOMECTOMY  12/09/2011   Procedure: MYOMECTOMY;  Surgeon: Rome Rigg, MD;  Location: [facility];  Service: Gynecology;  Laterality: N/A;  Abdominal Myomectomy, Biopsy abdominal Myometrium   Social History[1] Social History   Social History Narrative   Not on file     Immunization History  Administered Date(s) Administered   PFIZER(Purple Top)SARS-COV-2 Vaccination 05/16/2020, 06/06/2020     Objective: Vital Signs: BP 126/69   Pulse 84   Temp 97.8 F (36.6 C)   Resp 14   Ht 5' 2 (1.575 m)   Wt 194 lb 6.4 oz (88.2 kg)   LMP 12/27/2013   BMI 35.56 kg/m    Physical Exam Vitals and nursing note reviewed.  Constitutional:      Appearance: She is well-developed.  HENT:     Head: Normocephalic and atraumatic.  Eyes:     Conjunctiva/sclera: Conjunctivae normal.  Cardiovascular:     Rate and Rhythm: Normal rate and regular rhythm.     Heart sounds: Normal heart sounds.  Pulmonary:     Effort: Pulmonary effort is normal.     Breath sounds: Normal breath sounds.  Abdominal:     General: Bowel sounds are normal.     Palpations: Abdomen is soft.  Musculoskeletal:     Cervical back: Normal range of motion.  Lymphadenopathy:     Cervical: No cervical adenopathy.  Skin:    General: Skin is warm and dry.     Capillary Refill: Capillary refill takes less than 2 seconds.  Neurological:     Mental Status: She is alert and oriented to person, place, and time.  Psychiatric:        Behavior: Behavior normal.      Musculoskeletal Exam: Cervical, thoracic and lumbar spine were in good range of motion.  There was no SI joint tenderness.  Shoulder joints, elbow joints, wrist joints, MCPs, PIPs and DIPs were in good range of motion with no synovitis.  Bilateral  PIP and DIP thickening was noted.  No synovitis was noted.  She had limited range of motion of left hip joint without discomfort.  Right hip joint was in good range of motion.  Knee joints were in good range of motion without any warmth swelling or effusion.  There was no tenderness over ankles or MTPs.   CDAI Exam: CDAI Score: -- Patient Global: 10 / 100; Provider Global: 10 / 100 Swollen: --; Tender: -- Joint Exam 10/26/2024   No joint exam has been documented for this visit   There is currently no information documented on the homunculus. Go to the Rheumatology activity and complete the  homunculus joint exam.  Investigation: No additional findings.  Imaging: No results found.  Recent Labs: Lab Results  Component Value Date   WBC 4.4 10/24/2024   HGB 11.3 (L) 10/24/2024   PLT 235 10/24/2024   NA 140 10/24/2024   K 3.8 10/24/2024   CL 103 10/24/2024   CO2 28 10/24/2024   GLUCOSE 93 10/24/2024   BUN 13 10/24/2024   CREATININE 1.12 (H) 10/24/2024   BILITOT 0.4 10/24/2024   ALKPHOS 49 01/19/2017   AST 15 10/24/2024   ALT 18 10/24/2024   PROT 7.0 10/24/2024   ALBUMIN 4.2 01/19/2017   CALCIUM 9.3 10/24/2024   GFRAA 104 01/22/2021    Speciality Comments: PLQ Eye Exam: 06/23/2024 [facility] Follow up in 1 year    Procedures:  No procedures performed Allergies: Latex, Fluticasone, and Penicillins   Assessment / Plan:     Visit Diagnoses: Rheumatoid arthritis of multiple sites with negative rheumatoid factor (HCC)-patient denies having a flare of rheumatoid arthritis.  She has been doing well on hydroxychloroquine  200 mg p.o. twice daily Monday to Friday without any interruption.  No synovitis was noted on the examination today.  High risk medication use - Plaquenil  200 mg 1 tablet by mouth twice daily Monday through Friday. PLQ Eye Exam: 06/23/2024  Elevated serum creatinine-October 25, 2023 CBC and CMP were stable except creatinine was elevated at  1.12.  Patient states she has been drinking more tea recently.  I advised her to increase water intake and repeat lab (BMP) in 1 to 2 weeks.  Patient denies taking NSAIDs.  Primary osteoarthritis of both hands-bilateral PIP and DIP thickening was noted.  No synovitis was noted.  Trochanteric bursitis of both hips-doing better without any tenderness.  Primary osteoarthritis of left hip - December 2025 x-rays of the left hip showed severe end-stage osteoarthritis.  She was given a left hip joint injection in December 2025 and had a good response to it.  She will follow-up with Dr. Jerri.  Primary osteoarthritis of both feet-denies any discomfort today.  Vitamin D  deficiency - DEXA scan on September 19, 2019 was within normal limits.  Patient is states that she had recent DEXA scan.  I do not have results available.  Other fatigue-she relates fatigue to insomnia.  She denies any fatigue today.  Primary insomnia-good sleep hygiene was discussed.  History of hypertension-blood pressure was normal at 126/69.  Mixed hyperlipidemia  History of colon polyps  Orders: No orders of the defined types were placed in this encounter.  No orders of the defined types were placed in this encounter.    Follow-Up Instructions: Return in about 5 months (around 03/25/2025) for Rheumatoid arthritis.   Maya Nash, MD  Note - This record has been created using Animal nutritionist.  Chart creation errors have been sought, but may not always  have been located. Such creation errors do not reflect on  the standard of medical care.    [1]  Social History Tobacco Use   Smoking status: Never    Passive exposure: Never   Smokeless tobacco: Never  Vaping Use   Vaping status: Never Used  Substance Use Topics   Alcohol use: No   Drug use: No   "

## 2024-10-24 ENCOUNTER — Other Ambulatory Visit: Payer: Self-pay | Admitting: *Deleted

## 2024-10-24 DIAGNOSIS — Z79899 Other long term (current) drug therapy: Secondary | ICD-10-CM

## 2024-10-24 DIAGNOSIS — R5383 Other fatigue: Secondary | ICD-10-CM

## 2024-10-24 DIAGNOSIS — M0609 Rheumatoid arthritis without rheumatoid factor, multiple sites: Secondary | ICD-10-CM

## 2024-10-24 DIAGNOSIS — E559 Vitamin D deficiency, unspecified: Secondary | ICD-10-CM

## 2024-10-25 ENCOUNTER — Ambulatory Visit: Payer: Self-pay | Admitting: Rheumatology

## 2024-10-25 DIAGNOSIS — Z79899 Other long term (current) drug therapy: Secondary | ICD-10-CM

## 2024-10-25 LAB — CBC WITH DIFFERENTIAL/PLATELET
Absolute Lymphocytes: 1280 {cells}/uL (ref 850–3900)
Absolute Monocytes: 326 {cells}/uL (ref 200–950)
Basophils Absolute: 48 {cells}/uL (ref 0–200)
Basophils Relative: 1.1 %
Eosinophils Absolute: 198 {cells}/uL (ref 15–500)
Eosinophils Relative: 4.5 %
HCT: 35 % — ABNORMAL LOW (ref 35.9–46.0)
Hemoglobin: 11.3 g/dL — ABNORMAL LOW (ref 11.7–15.5)
MCH: 26.7 pg — ABNORMAL LOW (ref 27.0–33.0)
MCHC: 32.3 g/dL (ref 31.6–35.4)
MCV: 82.7 fL (ref 81.4–101.7)
MPV: 11.4 fL (ref 7.5–12.5)
Monocytes Relative: 7.4 %
Neutro Abs: 2548 {cells}/uL (ref 1500–7800)
Neutrophils Relative %: 57.9 %
Platelets: 235 10*3/uL (ref 140–400)
RBC: 4.23 Million/uL (ref 3.80–5.10)
RDW: 13 % (ref 11.0–15.0)
Total Lymphocyte: 29.1 %
WBC: 4.4 10*3/uL (ref 3.8–10.8)

## 2024-10-25 LAB — COMPREHENSIVE METABOLIC PANEL WITH GFR
AG Ratio: 1.8 (calc) (ref 1.0–2.5)
ALT: 18 U/L (ref 6–29)
AST: 15 U/L (ref 10–35)
Albumin: 4.5 g/dL (ref 3.6–5.1)
Alkaline phosphatase (APISO): 51 U/L (ref 37–153)
BUN/Creatinine Ratio: 12 (calc) (ref 6–22)
BUN: 13 mg/dL (ref 7–25)
CO2: 28 mmol/L (ref 20–32)
Calcium: 9.3 mg/dL (ref 8.6–10.4)
Chloride: 103 mmol/L (ref 98–110)
Creat: 1.12 mg/dL — ABNORMAL HIGH (ref 0.50–1.05)
Globulin: 2.5 g/dL (ref 1.9–3.7)
Glucose, Bld: 93 mg/dL (ref 65–99)
Potassium: 3.8 mmol/L (ref 3.5–5.3)
Sodium: 140 mmol/L (ref 135–146)
Total Bilirubin: 0.4 mg/dL (ref 0.2–1.2)
Total Protein: 7 g/dL (ref 6.1–8.1)
eGFR: 56 mL/min/{1.73_m2} — ABNORMAL LOW

## 2024-10-25 LAB — VITAMIN D 25 HYDROXY (VIT D DEFICIENCY, FRACTURES): Vit D, 25-Hydroxy: 38 ng/mL (ref 30–100)

## 2024-10-25 NOTE — Progress Notes (Signed)
 Hemoglobin is low and stable.  Patient should take multivitamin with iron.  Creatinine is elevated.  Patient should have repeat BMP after increasing water intake.  She should avoid NSAIDs.  Vitamin D  is normal now.  Patient should continue take vitamin D .

## 2024-10-26 ENCOUNTER — Ambulatory Visit: Admitting: Rheumatology

## 2024-10-26 ENCOUNTER — Encounter: Payer: Self-pay | Admitting: Rheumatology

## 2024-10-26 VITALS — BP 126/69 | HR 84 | Temp 97.8°F | Resp 14 | Ht 62.0 in | Wt 194.4 lb

## 2024-10-26 DIAGNOSIS — M1612 Unilateral primary osteoarthritis, left hip: Secondary | ICD-10-CM | POA: Diagnosis not present

## 2024-10-26 DIAGNOSIS — M19042 Primary osteoarthritis, left hand: Secondary | ICD-10-CM

## 2024-10-26 DIAGNOSIS — M19041 Primary osteoarthritis, right hand: Secondary | ICD-10-CM

## 2024-10-26 DIAGNOSIS — Z8679 Personal history of other diseases of the circulatory system: Secondary | ICD-10-CM

## 2024-10-26 DIAGNOSIS — M19072 Primary osteoarthritis, left ankle and foot: Secondary | ICD-10-CM

## 2024-10-26 DIAGNOSIS — M7062 Trochanteric bursitis, left hip: Secondary | ICD-10-CM

## 2024-10-26 DIAGNOSIS — E782 Mixed hyperlipidemia: Secondary | ICD-10-CM | POA: Diagnosis not present

## 2024-10-26 DIAGNOSIS — M19071 Primary osteoarthritis, right ankle and foot: Secondary | ICD-10-CM | POA: Diagnosis not present

## 2024-10-26 DIAGNOSIS — R7989 Other specified abnormal findings of blood chemistry: Secondary | ICD-10-CM

## 2024-10-26 DIAGNOSIS — F5101 Primary insomnia: Secondary | ICD-10-CM

## 2024-10-26 DIAGNOSIS — Z79899 Other long term (current) drug therapy: Secondary | ICD-10-CM

## 2024-10-26 DIAGNOSIS — E559 Vitamin D deficiency, unspecified: Secondary | ICD-10-CM

## 2024-10-26 DIAGNOSIS — M0609 Rheumatoid arthritis without rheumatoid factor, multiple sites: Secondary | ICD-10-CM | POA: Diagnosis not present

## 2024-10-26 DIAGNOSIS — R5383 Other fatigue: Secondary | ICD-10-CM | POA: Diagnosis not present

## 2024-10-26 DIAGNOSIS — M7061 Trochanteric bursitis, right hip: Secondary | ICD-10-CM | POA: Diagnosis not present

## 2024-10-26 DIAGNOSIS — Z860100 Personal history of colon polyps, unspecified: Secondary | ICD-10-CM

## 2024-10-26 NOTE — Patient Instructions (Signed)

## 2025-04-26 ENCOUNTER — Ambulatory Visit: Admitting: Rheumatology
